# Patient Record
Sex: Female | Born: 1985 | Race: Black or African American | Hispanic: No | Marital: Single | Smoking: Former smoker
Health system: Southern US, Community
[De-identification: ages and names within clinical notes are randomized; demographics above are authoritative.]

## PROBLEM LIST (undated history)

## (undated) ENCOUNTER — Ambulatory Visit: Source: Home / Self Care

## (undated) DIAGNOSIS — J45909 Unspecified asthma, uncomplicated: Secondary | ICD-10-CM

---

## 2006-09-14 ENCOUNTER — Ambulatory Visit: Payer: Self-pay | Admitting: Family Medicine

## 2006-11-08 ENCOUNTER — Ambulatory Visit: Payer: Self-pay | Admitting: Family Medicine

## 2006-11-08 ENCOUNTER — Encounter (INDEPENDENT_AMBULATORY_CARE_PROVIDER_SITE_OTHER): Payer: Self-pay | Admitting: Family Medicine

## 2006-11-08 LAB — CONVERTED CEMR LAB
Antibody Screen: NEGATIVE
Basophils Absolute: 0 10*3/uL
Basophils Relative: 0 %
Eosinophils Absolute: 0.2 10*3/uL
Eosinophils Relative: 2 %
HCT: 37.6 %
Hemoglobin: 12.2 g/dL
Hepatitis B Surface Ag: NEGATIVE
Lymphocytes Relative: 21 %
Lymphs Abs: 2.3 10*3/uL
MCHC: 32.4 g/dL
MCV: 90.6 fL
Monocytes Absolute: 0.8 10*3/uL — ABNORMAL HIGH
Monocytes Relative: 7 %
Neutro Abs: 7.9 10*3/uL — ABNORMAL HIGH
Neutrophils Relative %: 70 %
Platelets: 269 10*3/uL
RBC: 4.15 M/uL
RDW: 13.3 %
Rh Type: POSITIVE
Rubella: 94.5 [IU]/mL — ABNORMAL HIGH
WBC: 11.2 10*3/uL — ABNORMAL HIGH

## 2006-11-22 ENCOUNTER — Ambulatory Visit: Payer: Self-pay | Admitting: Family Medicine

## 2006-11-22 ENCOUNTER — Encounter (INDEPENDENT_AMBULATORY_CARE_PROVIDER_SITE_OTHER): Payer: Self-pay | Admitting: Family Medicine

## 2006-11-22 ENCOUNTER — Other Ambulatory Visit: Admission: RE | Admit: 2006-11-22 | Discharge: 2006-11-22 | Payer: Self-pay | Admitting: Family Medicine

## 2006-11-22 LAB — CONVERTED CEMR LAB
Chlamydia, DNA Probe: NEGATIVE
GC Probe Amp, Genital: NEGATIVE

## 2006-11-25 ENCOUNTER — Encounter (INDEPENDENT_AMBULATORY_CARE_PROVIDER_SITE_OTHER): Payer: Self-pay | Admitting: Family Medicine

## 2006-11-28 ENCOUNTER — Encounter (INDEPENDENT_AMBULATORY_CARE_PROVIDER_SITE_OTHER): Payer: Self-pay | Admitting: Family Medicine

## 2006-11-30 ENCOUNTER — Ambulatory Visit (HOSPITAL_COMMUNITY): Admission: RE | Admit: 2006-11-30 | Discharge: 2006-11-30 | Payer: Self-pay | Admitting: Sports Medicine

## 2006-11-30 ENCOUNTER — Encounter (INDEPENDENT_AMBULATORY_CARE_PROVIDER_SITE_OTHER): Payer: Self-pay | Admitting: Family Medicine

## 2007-01-10 ENCOUNTER — Ambulatory Visit: Payer: Self-pay | Admitting: Family Medicine

## 2007-01-12 ENCOUNTER — Ambulatory Visit: Payer: Self-pay | Admitting: Family Medicine

## 2007-01-17 ENCOUNTER — Encounter (INDEPENDENT_AMBULATORY_CARE_PROVIDER_SITE_OTHER): Payer: Self-pay | Admitting: Family Medicine

## 2007-01-17 ENCOUNTER — Ambulatory Visit (HOSPITAL_COMMUNITY): Admission: RE | Admit: 2007-01-17 | Discharge: 2007-01-17 | Payer: Self-pay | Admitting: Pediatrics

## 2007-02-08 ENCOUNTER — Ambulatory Visit: Payer: Self-pay | Admitting: Family Medicine

## 2007-02-08 LAB — CONVERTED CEMR LAB: Hemoglobin: 11.6 g/dL

## 2007-02-23 ENCOUNTER — Ambulatory Visit: Payer: Self-pay | Admitting: Family Medicine

## 2007-03-01 ENCOUNTER — Encounter (INDEPENDENT_AMBULATORY_CARE_PROVIDER_SITE_OTHER): Payer: Self-pay | Admitting: Family Medicine

## 2007-03-01 ENCOUNTER — Ambulatory Visit (HOSPITAL_COMMUNITY): Admission: RE | Admit: 2007-03-01 | Discharge: 2007-03-01 | Payer: Self-pay | Admitting: Family Medicine

## 2007-03-09 ENCOUNTER — Ambulatory Visit: Payer: Self-pay | Admitting: Family Medicine

## 2007-03-09 LAB — CONVERTED CEMR LAB: GTT, 1 hr: 131 mg/dL

## 2007-03-30 ENCOUNTER — Encounter: Payer: Self-pay | Admitting: Family Medicine

## 2007-03-31 ENCOUNTER — Telehealth: Payer: Self-pay | Admitting: *Deleted

## 2007-04-12 ENCOUNTER — Encounter (INDEPENDENT_AMBULATORY_CARE_PROVIDER_SITE_OTHER): Payer: Self-pay | Admitting: Family Medicine

## 2007-04-12 ENCOUNTER — Ambulatory Visit: Payer: Self-pay | Admitting: Family Medicine

## 2007-04-12 LAB — CONVERTED CEMR LAB
MCHC: 31.8 g/dL (ref 30.0–36.0)
MCV: 91.7 fL (ref 78.0–100.0)
RDW: 13.7 % (ref 11.5–15.5)
WBC: 11.1 10*3/uL — ABNORMAL HIGH (ref 4.0–10.5)

## 2007-04-13 ENCOUNTER — Ambulatory Visit: Payer: Self-pay | Admitting: Obstetrics and Gynecology

## 2007-04-13 ENCOUNTER — Encounter (INDEPENDENT_AMBULATORY_CARE_PROVIDER_SITE_OTHER): Payer: Self-pay | Admitting: Family Medicine

## 2007-04-13 ENCOUNTER — Ambulatory Visit (HOSPITAL_COMMUNITY): Admission: RE | Admit: 2007-04-13 | Discharge: 2007-04-13 | Payer: Self-pay | Admitting: Emergency Medicine

## 2007-04-19 ENCOUNTER — Ambulatory Visit: Payer: Self-pay | Admitting: Family Medicine

## 2007-04-19 ENCOUNTER — Telehealth: Payer: Self-pay | Admitting: *Deleted

## 2007-04-25 ENCOUNTER — Ambulatory Visit: Payer: Self-pay | Admitting: Obstetrics and Gynecology

## 2007-04-25 ENCOUNTER — Ambulatory Visit: Payer: Self-pay | Admitting: Family Medicine

## 2007-05-02 ENCOUNTER — Inpatient Hospital Stay (HOSPITAL_COMMUNITY): Admission: RE | Admit: 2007-05-02 | Discharge: 2007-05-06 | Payer: Self-pay | Admitting: Obstetrics & Gynecology

## 2007-05-02 ENCOUNTER — Ambulatory Visit: Payer: Self-pay | Admitting: Gynecology

## 2007-06-03 ENCOUNTER — Ambulatory Visit: Payer: Self-pay | Admitting: Family Medicine

## 2007-08-03 ENCOUNTER — Ambulatory Visit: Payer: Self-pay | Admitting: Family Medicine

## 2007-10-21 ENCOUNTER — Ambulatory Visit: Payer: Self-pay | Admitting: Family Medicine

## 2008-02-02 ENCOUNTER — Ambulatory Visit: Payer: Self-pay | Admitting: Family Medicine

## 2008-02-02 ENCOUNTER — Encounter: Payer: Self-pay | Admitting: Family Medicine

## 2008-05-03 ENCOUNTER — Ambulatory Visit: Payer: Self-pay | Admitting: Family Medicine

## 2008-05-03 LAB — CONVERTED CEMR LAB: Beta hcg, urine, semiquantitative: NEGATIVE

## 2008-06-07 IMAGING — US US OB FOLLOW-UP
1 series · 7 of 7 positions shown · non-contrast
Comparison: none

OBSTETRICAL ULTRASOUND:

 This ultrasound exam was performed in the [HOSPITAL] Ultrasound Department.  The OB US report was generated in the AS system, and faxed to the ordering physician.  This report is also available in [REDACTED] PACS.

[Series 1: us ob follow-up · 0.28mm/px · 7 of 7 slices shown]
[im 1/7]
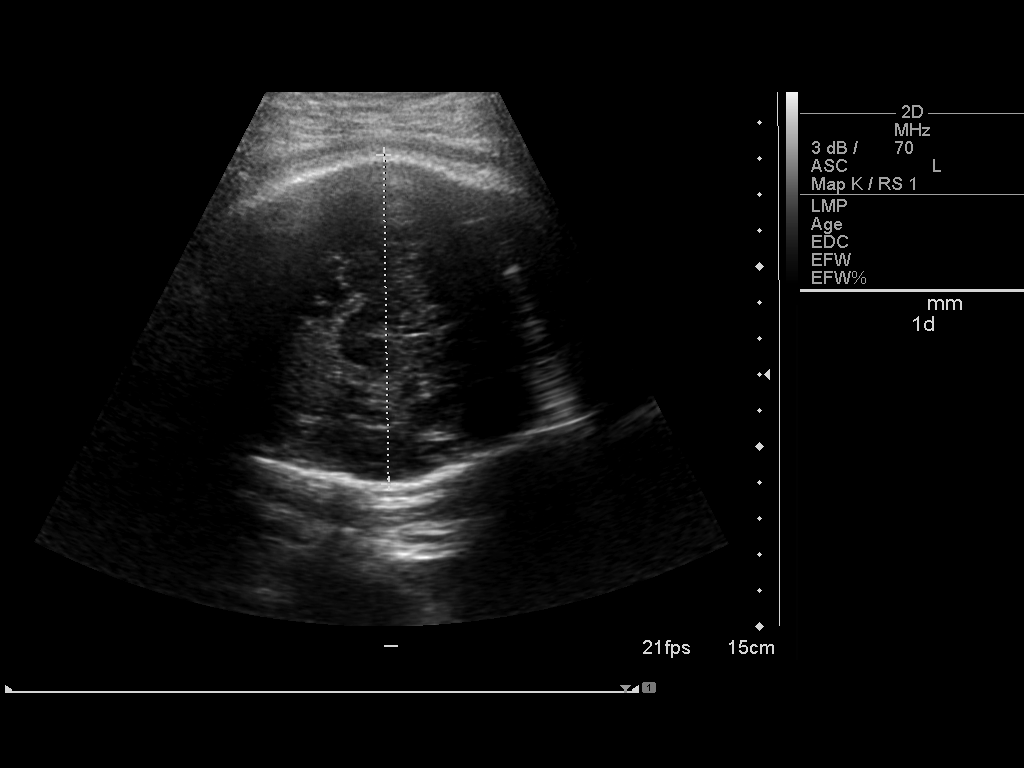
[im 2/7]
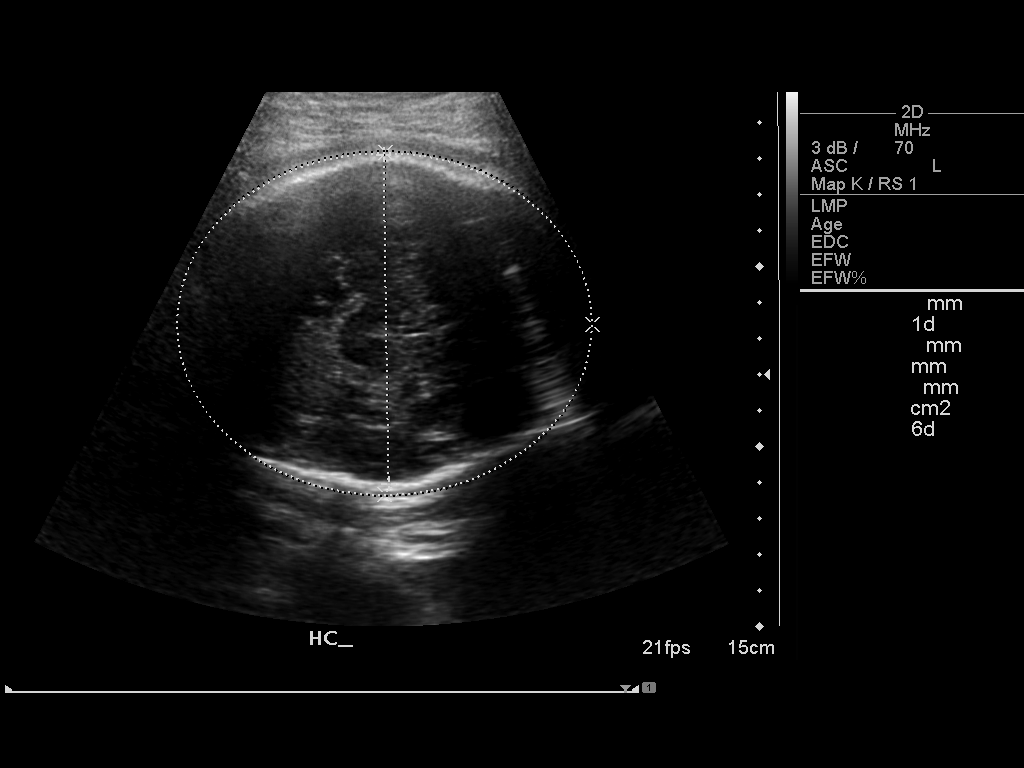
[im 3/7]
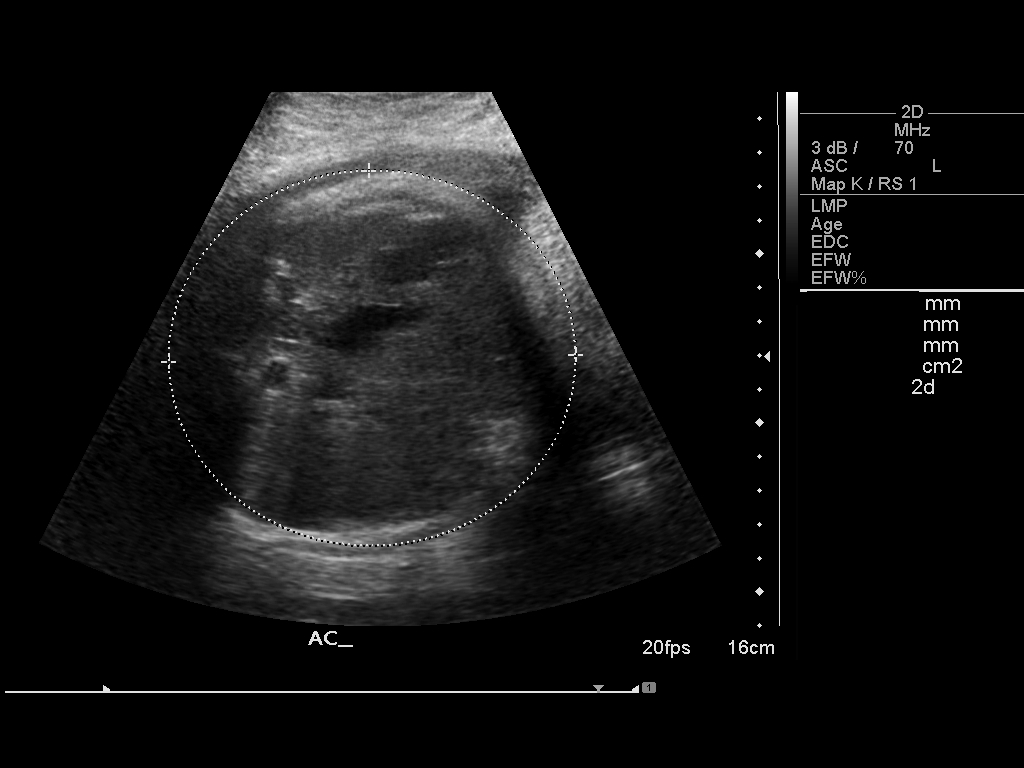
[im 4/7]
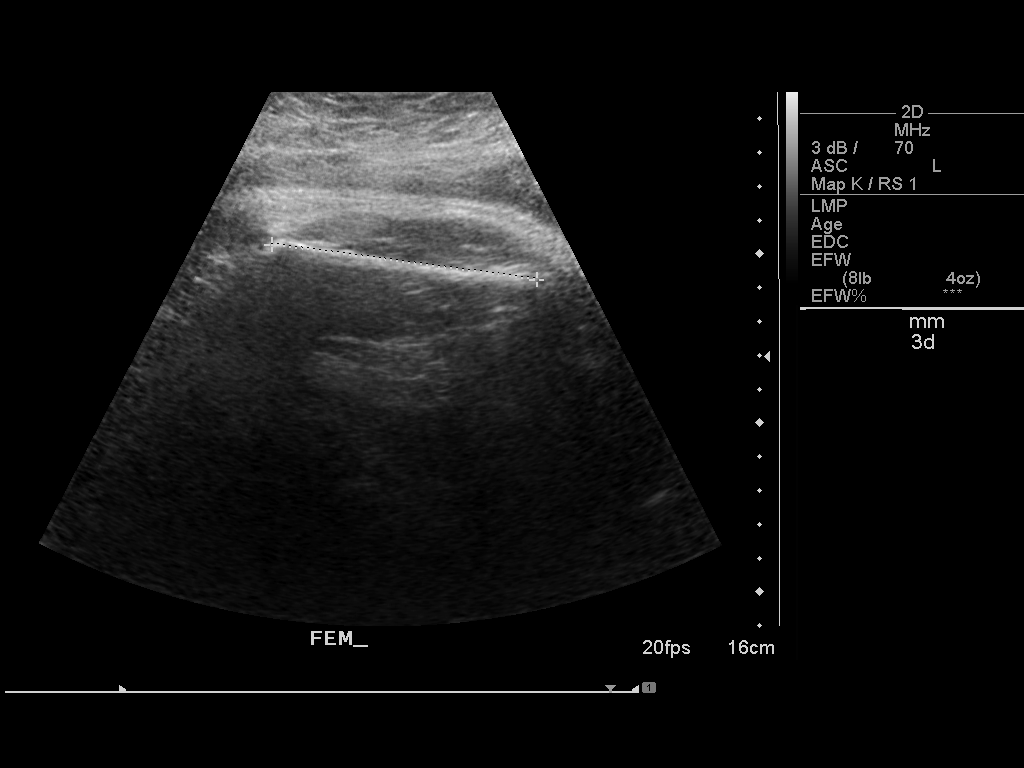
[im 5/7]
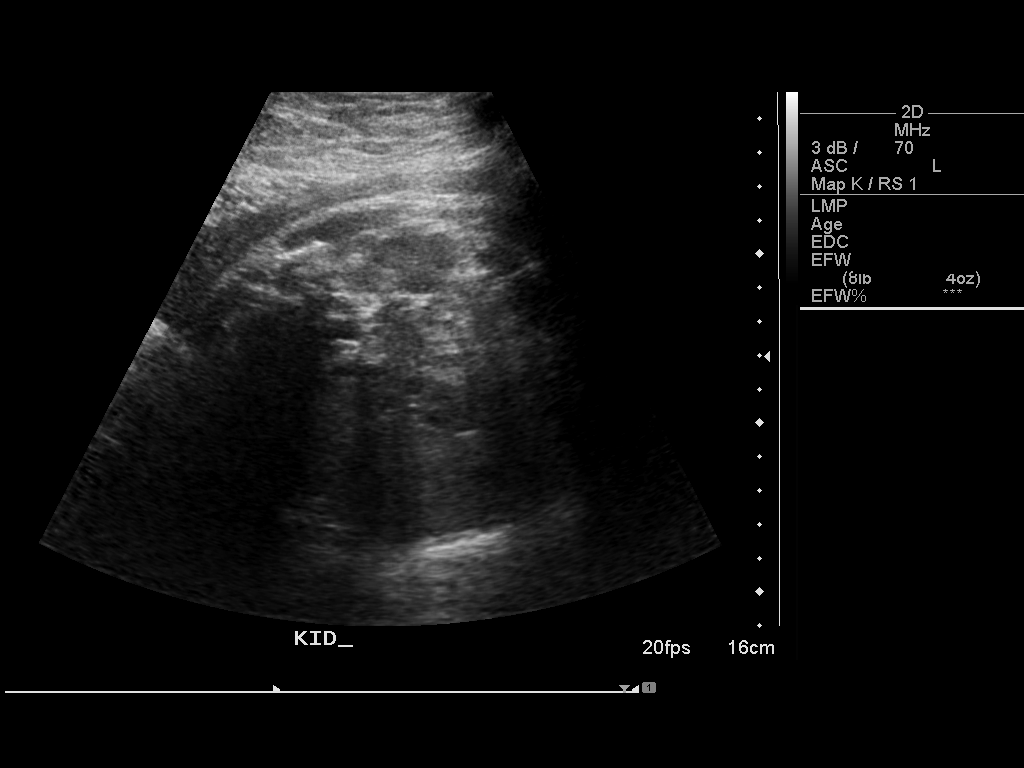
[im 6/7]
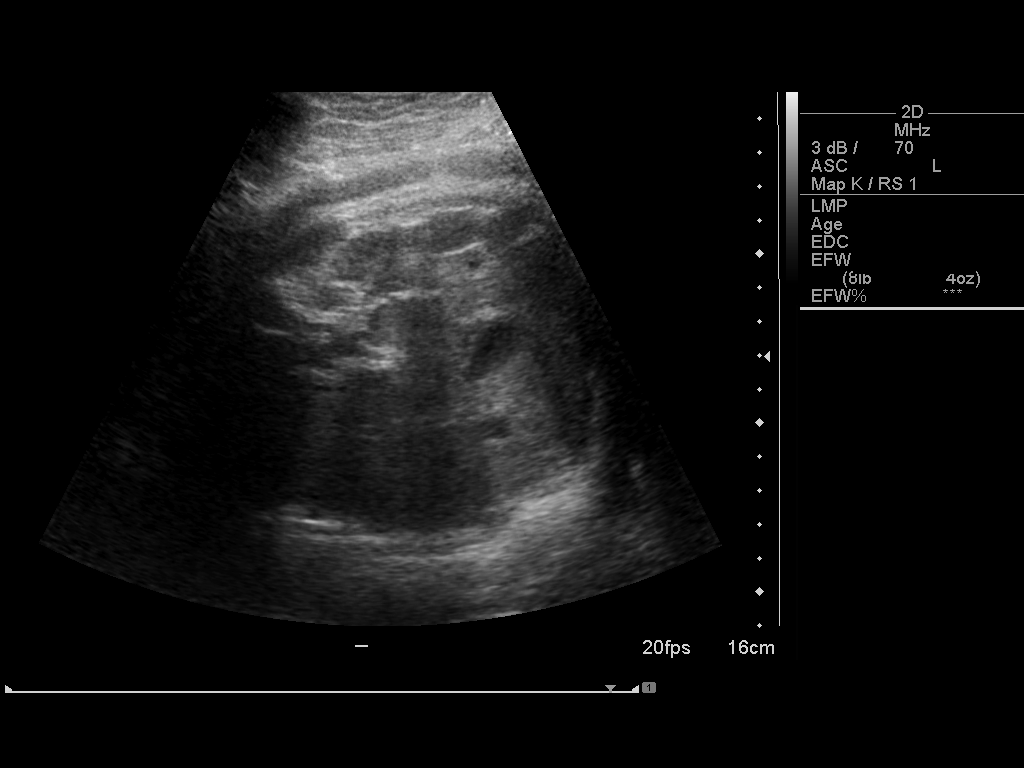
[im 7/7]
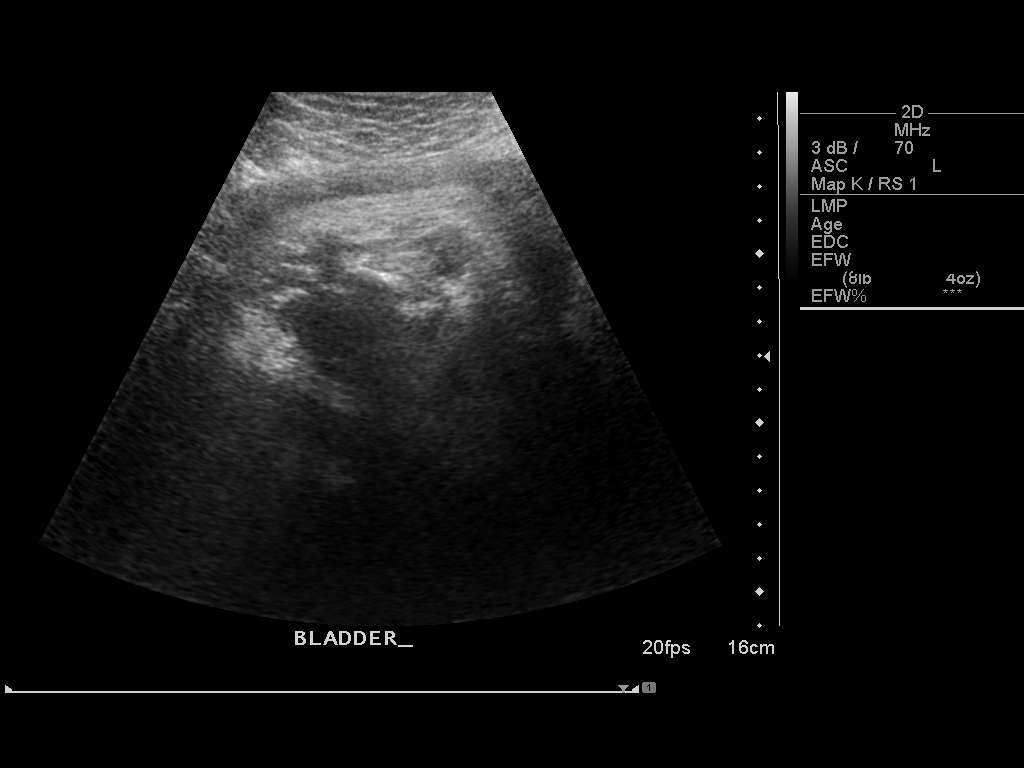

[7 of 7 positions shown; findings below may reference images not displayed]

IMPRESSION: See AS Obstetric US report.

## 2008-07-27 ENCOUNTER — Encounter (INDEPENDENT_AMBULATORY_CARE_PROVIDER_SITE_OTHER): Payer: Self-pay | Admitting: Family Medicine

## 2008-07-27 ENCOUNTER — Encounter: Payer: Self-pay | Admitting: Family Medicine

## 2008-07-27 ENCOUNTER — Ambulatory Visit: Payer: Self-pay | Admitting: Family Medicine

## 2008-07-27 DIAGNOSIS — J029 Acute pharyngitis, unspecified: Secondary | ICD-10-CM | POA: Insufficient documentation

## 2008-07-27 LAB — CONVERTED CEMR LAB: Rapid Strep: POSITIVE

## 2008-11-14 ENCOUNTER — Ambulatory Visit: Payer: Self-pay | Admitting: Family Medicine

## 2010-07-28 ENCOUNTER — Inpatient Hospital Stay (INDEPENDENT_AMBULATORY_CARE_PROVIDER_SITE_OTHER)
Admission: RE | Admit: 2010-07-28 | Discharge: 2010-07-28 | Disposition: A | Discharge status: Home / Self Care | Payer: BC Managed Care – PPO | Source: Ambulatory Visit | Attending: Family Medicine | Admitting: Family Medicine

## 2010-07-28 DIAGNOSIS — J020 Streptococcal pharyngitis: Secondary | ICD-10-CM

## 2010-07-29 LAB — POCT RAPID STREP A (OFFICE): Streptococcus, Group A Screen (Direct): POSITIVE — AB

## 2010-10-07 NOTE — Op Note (Signed)
Karla Estrada, Karla Estrada                ACCOUNT NO.:  [id number]   MEDICAL RECORD NO.:  [id number]          PATIENT TYPE:  INP   LOCATION:  9148                          FACILITY:  [facility]   PHYSICIAN:  Ginger Carne, MD  DATE OF BIRTH:  05-07-1986   DATE OF PROCEDURE:  DATE OF DISCHARGE:                               OPERATIVE REPORT   PREOPERATIVE DIAGNOSIS:  Failure to progress first stage of labor.   POSTOPERATIVE DIAGNOSIS:  1. Failure to progress first stage of labor.  2. Term viable delivery of female infant.   PROCEDURE:  Primary low transverse cesarean section.   SURGEON:  Ginger Carne, MD.   ASSISTANT:  None.   COMPLICATIONS:  None immediate.   ESTIMATED BLOOD LOSS:  650 mL.   ANESTHESIA:  Spinal.   SPECIMEN:  Cord bloods.   OPERATIVE FINDINGS:  Term infant female delivered in a vertex posterior  deflexed presentation.  Apgar and weight per delivery room record, no  gross abnormalities.  Baby cried spontaneously at delivery.  Amniotic  fluid was clear, non foul-smelling.  Uterus, tubes and ovaries showed  normal decidual changes of pregnancy.  Three-vessel cord central  insertion, complete placenta.   OPERATIVE PROCEDURE:  The patient prepped and draped in the usual  fashion and placed in the left lateral supine position.  Betadine  solution used for antiseptic and the patient was catheterized prior to  procedure.  After adequate spinal analgesia, a Pfannenstiel incision was  made and the abdomen opened.  The lower uterine segment was incised  transversely after developing the bladder flap.  The baby delivered,  cord clamped and cut and infant given to the pediatric staff after bulb  suctioning.  Placenta removed manually, uterus inspected.  Closure of  the uterine musculature in one layer of #0 Vicryl running interlocking  suture from either end to midline.  Bleeding  points hemostatically checked.  Blood clots removed.  Closure of the  fascia with double loop  zero PDS running suture.  Skin staples for the  skin.  The patient tolerated the procedure well and returned to post  anesthesia recovery room in excellent condition.      Ginger Carne, MD  Electronically Signed     SHB/MEDQ  D:  05/03/2007  T:  05/03/2007  Job:  [id number]

## 2010-10-10 NOTE — Discharge Summary (Signed)
NAMEARACELYS, GLADE                ACCOUNT NO.:  [id number]   MEDICAL RECORD NO.:  [id number]          PATIENT TYPE:  INP   LOCATION:  9148                          FACILITY:  [facility]   PHYSICIAN:  Lazaro Arms, M.D.   DATE OF BIRTH:  02-Jun-1985   DATE OF ADMISSION:  05/02/2007  DATE OF DISCHARGE:  05/06/2007                               DISCHARGE SUMMARY   REASON FOR ADMISSION:  Induction of labor for post dates.   DISCHARGE DIAGNOSIS:  Primary low transverse cesarean section for  failure to progress.   BRIEF HOSPITAL COURSE:  This is a 25 year old G1, P0, who presented at  approximately 72 and 2 weeks' gestation for induction of labor for post  dates.  The infant had been measuring large for gestational age.  Induction was attempted with Pitocin.  The patient initially made  progression but stalled around 3-4 cm so was taken for C-section under a  spinal anesthesia.  She went on to deliver a viable female infant weighing  8 pounds 4 ounces with Apgars of 9 and 9.  Estimated blood loss was 650  mL.  Mom is breast-feeding, using Depo for contraception.  She was GBS-  positive.  Blood type:  See paper chart.  Discharge hemoglobin 9.8.  rubella immune.  Rh positive.  The patient will follow up next week at  the family practice center to have her staples removed and then she will  have a routine postpartum follow-up visit in 3 weeks.  Mom and baby were  discharged home without any complications after the usual postpartum  course.      Altamese Cabal, M.D.      Lazaro Arms, M.D.  Electronically Signed    KS/MEDQ  D:  05/15/2007  T:  05/16/2007  Job:  [id number]

## 2011-03-02 LAB — CBC
HCT: 28.6 — ABNORMAL LOW
HCT: 35.1 — ABNORMAL LOW
MCHC: 34.3
MCHC: 34.8
Platelets: 190
WBC: 11.8 — ABNORMAL HIGH
WBC: 13.8 — ABNORMAL HIGH

## 2011-03-02 LAB — RPR: RPR Ser Ql: NONREACTIVE

## 2012-09-20 ENCOUNTER — Encounter (HOSPITAL_COMMUNITY): Payer: Self-pay | Admitting: Emergency Medicine

## 2012-09-20 ENCOUNTER — Emergency Department (INDEPENDENT_AMBULATORY_CARE_PROVIDER_SITE_OTHER)
Admission: EM | Admit: 2012-09-20 | Discharge: 2012-09-20 | Disposition: A | Discharge status: Home / Self Care | Payer: Self-pay | Source: Home / Self Care | Attending: Family Medicine | Admitting: Family Medicine

## 2012-09-20 DIAGNOSIS — R591 Generalized enlarged lymph nodes: Secondary | ICD-10-CM

## 2012-09-20 DIAGNOSIS — R599 Enlarged lymph nodes, unspecified: Secondary | ICD-10-CM

## 2012-09-20 HISTORY — DX: Unspecified asthma, uncomplicated: J45.909

## 2012-09-20 MED ORDER — CEPHALEXIN 500 MG PO CAPS: 500.0000 mg | ORAL_CAPSULE | Freq: Four times a day (QID) | ORAL | Status: DC

## 2012-09-20 NOTE — ED Notes (Signed)
Pt c/o swollen lymphnodes x 3 days. Has pain on right side from the ear down to her shoulder. Has had congestion in her chest. Pt is alert and oriented.

## 2012-09-20 NOTE — ED Provider Notes (Signed)
History     CSN: [id number]  Arrival date & time 09/20/12  1445   First MD Initiated Contact with Patient 09/20/12 1543      Chief Complaint  Patient presents with  . Lymphadenopathy    (Consider location/radiation/quality/duration/timing/severity/associated sxs/prior treatment) Patient is a 27 y.o. female presenting with neck injury. The history is provided by the patient. No language interpreter was used.  Neck Injury This is a new problem. The current episode started more than 2 days ago. The problem occurs constantly. The problem has been gradually worsening. Pertinent negatives include no chest pain, no abdominal pain and no shortness of breath. Nothing aggravates the symptoms. Nothing relieves the symptoms. She has tried nothing for the symptoms.   Pt complains of sore swollen glands Past Medical History  Diagnosis Date  . Asthma     Past Surgical History  Procedure Laterality Date  . Cesarean section      No family history on file.  History  Substance Use Topics  . Smoking status: Current Every Day Smoker -- 0.50 packs/day    Types: Cigarettes  . Smokeless tobacco: Not on file  . Alcohol Use: No    OB History   Grav Para Term Preterm Abortions TAB SAB Ect Mult Living                  Review of Systems  Respiratory: Negative for shortness of breath.   Cardiovascular: Negative for chest pain.  Gastrointestinal: Negative for abdominal pain.  All other systems reviewed and are negative.    Allergies  Review of patient's allergies indicates no known allergies.  Home Medications  No current outpatient prescriptions on file.  BP 110/76  Pulse 85  Temp(Src) 98.1 F (36.7 C) (Oral)  Resp 16  SpO2 94%  LMP 09/02/2012  Physical Exam  Nursing note and vitals reviewed. Constitutional: She appears well-developed and well-nourished.  HENT:  Head: Normocephalic and atraumatic.  Left Ear: External ear normal.  Nose: Nose normal.  Mouth/Throat:  Oropharynx is clear and moist.  Tender right post auricular and anterior cervical lymph nodes,  No obvious area of infection  Eyes: Conjunctivae are normal. Pupils are equal, round, and reactive to light.  Neck: Normal range of motion.  Cardiovascular: Normal rate and regular rhythm.   Pulmonary/Chest: Effort normal.  Musculoskeletal: Normal range of motion.  Lymphadenopathy:    She has cervical adenopathy.  Neurological: She is alert.  Skin: Skin is warm.    ED Course  Procedures (including critical care time)  Labs Reviewed - No data to display No results found.   1. Lymphadenopathy       MDM  Keflex  Follow up at family practice for recheck in 1 week       Elson Areas, PA-C 09/20/12 1619  Lonia Skinner [place], PA-C 09/25/12 1521

## 2012-09-25 NOTE — ED Provider Notes (Signed)
Medical screening examination/treatment/procedure(s) were performed by non-physician practitioner and as supervising physician I was immediately available for consultation/collaboration.  Raynald Blend, MD 09/25/12 [phone]

## 2013-09-22 ENCOUNTER — Encounter (HOSPITAL_COMMUNITY): Payer: Self-pay | Admitting: Emergency Medicine

## 2013-09-22 ENCOUNTER — Emergency Department (INDEPENDENT_AMBULATORY_CARE_PROVIDER_SITE_OTHER)
Admission: EM | Admit: 2013-09-22 | Discharge: 2013-09-22 | Disposition: A | Discharge status: Home / Self Care | Payer: Self-pay | Source: Home / Self Care

## 2013-09-22 DIAGNOSIS — J039 Acute tonsillitis, unspecified: Secondary | ICD-10-CM

## 2013-09-22 DIAGNOSIS — R0982 Postnasal drip: Secondary | ICD-10-CM

## 2013-09-22 DIAGNOSIS — J309 Allergic rhinitis, unspecified: Secondary | ICD-10-CM

## 2013-09-22 LAB — POCT RAPID STREP A: Streptococcus, Group A Screen (Direct): NEGATIVE

## 2013-09-22 MED ORDER — AMOXICILLIN 500 MG PO CAPS: 1000.0000 mg | ORAL_CAPSULE | Freq: Two times a day (BID) | ORAL | Status: DC

## 2013-09-22 NOTE — ED Provider Notes (Signed)
CSN: [id number]     Arrival date & time 09/22/13  0941 History   First MD Initiated Contact with Patient 09/22/13 1009     Chief Complaint  Patient presents with  . Sore Throat  . Facial Pain   (Consider location/radiation/quality/duration/timing/severity/associated sxs/prior Treatment) HPI Comments: C/o sore throat for 1 d. Preceded by nasal congestion with PND for 3 d. Mild R ear discomfort.   Past Medical History  Diagnosis Date  . Asthma    Past Surgical History  Procedure Laterality Date  . Cesarean section     History reviewed. No pertinent family history. History  Substance Use Topics  . Smoking status: Current Every Day Smoker -- 0.50 packs/day    Types: Cigarettes  . Smokeless tobacco: Not on file  . Alcohol Use: No   OB History   Grav Para Term Preterm Abortions TAB SAB Ect Mult Living                 Review of Systems  Constitutional: Negative.  Negative for fever, chills, activity change, appetite change and fatigue.  HENT: Positive for congestion, ear pain, postnasal drip, rhinorrhea and sore throat. Negative for ear discharge and facial swelling.   Eyes: Negative.   Respiratory: Positive for cough.   Cardiovascular: Negative.   Gastrointestinal: Negative.   Musculoskeletal: Negative.  Negative for neck pain and neck stiffness.  Skin: Negative for pallor and rash.  Neurological: Negative.     Allergies  Review of patient's allergies indicates no known allergies.  Home Medications   Prior to Admission medications   Medication Sig Start Date End Date Taking? Authorizing Provider  cephALEXin (KEFLEX) 500 MG capsule Take 1 capsule (500 mg total) by mouth 4 (four) times daily. 09/20/12   Elson AreasLeslie K Sofia, PA-C   BP 133/84  Pulse 86  Temp(Src) 98.1 F (36.7 C) (Oral)  Resp 16  SpO2 99%  LMP 09/14/2013 Physical Exam  Nursing note and vitals reviewed. Constitutional: She is oriented to person, place, and time. She appears well-developed and  well-nourished. No distress.  HENT:  Head: Normocephalic and atraumatic.  Mouth/Throat: Oropharyngeal exudate present.  Bilat TMs with mild retraction. No erythema or effusion OP with enlarged, cryptic palatine tonsils covered with exudates.  Eyes: EOM are normal. Pupils are equal, round, and reactive to light.  Neck: Normal range of motion. Neck supple.  Cardiovascular: Normal rate and regular rhythm.   Pulmonary/Chest: Effort normal and breath sounds normal. No respiratory distress. She has no wheezes. She has no rales.  Musculoskeletal: She exhibits no edema and no tenderness.  Lymphadenopathy:    She has no cervical adenopathy.  Neurological: She is alert and oriented to person, place, and time. No cranial nerve deficit.  Skin: Skin is warm and dry.  Psychiatric: She has a normal mood and affect.    ED Course  Procedures (including critical care time) Labs Review Labs Reviewed - No data to display  Imaging Review No results found. Rapid strep verbally reported as neg.  MDM   1. Tonsillitis with exudate   2. PND (post-nasal drip)   3. Allergic rhinitis     Amoxicillin OTC meds for allergic rhinitis Nasal sprays fluids     Hayden Rasmussenavid Leighanne Adolph, NP 09/22/13 1031

## 2013-09-22 NOTE — ED Notes (Signed)
C/o  Severe sore throat with white patches and redness.  Sinus congestion.  Low grade temp.  X 2 days.  Denies n/v/d   No otc meds taken for symptoms.

## 2013-09-22 NOTE — ED Provider Notes (Signed)
Medical screening examination/treatment/procedure(s) were performed by resident physician or non-physician practitioner and as supervising physician I was immediately available for consultation/collaboration.   Barkley BrunsKINDL,JAMES DOUGLAS MD.   Linna HoffJames D Kindl, MD 09/22/13 [phone]

## 2013-09-22 NOTE — Discharge Instructions (Signed)
Allergic Rhinitis Allegra or zyrtec for drainage Sudafed PE for congestion flonase nasal spray Saline nasal spray frequently Cepacol lozenges Ibuprofen 600 mg every 6 hours as needed Allergic rhinitis is when the mucous membranes in the nose respond to allergens. Allergens are particles in the air that cause your body to have an allergic reaction. This causes you to release allergic antibodies. Through a chain of events, these eventually cause you to release histamine into the blood stream. Although meant to protect the body, it is this release of histamine that causes your discomfort, such as frequent sneezing, congestion, and an itchy, runny nose.  CAUSES  Seasonal allergic rhinitis (hay fever) is caused by pollen allergens that may come from grasses, trees, and weeds. Year-round allergic rhinitis (perennial allergic rhinitis) is caused by allergens such as house dust mites, pet dander, and mold spores.  SYMPTOMS   Nasal stuffiness (congestion).  Itchy, runny nose with sneezing and tearing of the eyes. DIAGNOSIS  Your health care provider can help you determine the allergen or allergens that trigger your symptoms. If you and your health care provider are unable to determine the allergen, skin or blood testing may be used. TREATMENT  Allergic Rhinitis does not have a cure, but it can be controlled by:  Medicines and allergy shots (immunotherapy).  Avoiding the allergen. Hay fever may often be treated with antihistamines in pill or nasal spray forms. Antihistamines block the effects of histamine. There are over-the-counter medicines that may help with nasal congestion and swelling around the eyes. Check with your health care provider before taking or giving this medicine.  If avoiding the allergen or the medicine prescribed do not work, there are many new medicines your health care provider can prescribe. Stronger medicine may be used if initial measures are ineffective. Desensitizing  injections can be used if medicine and avoidance does not work. Desensitization is when a patient is given ongoing shots until the body becomes less sensitive to the allergen. Make sure you follow up with your health care provider if problems continue. HOME CARE INSTRUCTIONS It is not possible to completely avoid allergens, but you can reduce your symptoms by taking steps to limit your exposure to them. It helps to know exactly what you are allergic to so that you can avoid your specific triggers. SEEK MEDICAL CARE IF:   You have a fever.  You develop a cough that does not stop easily (persistent).  You have shortness of breath.  You start wheezing.  Symptoms interfere with normal daily activities. Document Released: 02/03/2001 Document Revised: 03/01/2013 Document Reviewed: 01/16/2013 [facility] Patient Information 2014 [place], [place].  Pharyngitis Pharyngitis is redness, pain, and swelling (inflammation) of your pharynx.  CAUSES  Pharyngitis is usually caused by infection. Most of the time, these infections are from viruses (viral) and are part of a cold. However, sometimes pharyngitis is caused by bacteria (bacterial). Pharyngitis can also be caused by allergies. Viral pharyngitis may be spread from person to person by coughing, sneezing, and personal items or utensils (cups, forks, spoons, toothbrushes). Bacterial pharyngitis may be spread from person to person by more intimate contact, such as kissing.  SIGNS AND SYMPTOMS  Symptoms of pharyngitis include:   Sore throat.   Tiredness (fatigue).   Low-grade fever.   Headache.  Joint pain and muscle aches.  Skin rashes.  Swollen lymph nodes.  Plaque-like film on throat or tonsils (often seen with bacterial pharyngitis). DIAGNOSIS  Your health care provider will ask you questions about your illness and  your symptoms. Your medical history, along with a physical exam, is often all that is needed to diagnose pharyngitis.  Sometimes, a rapid strep test is done. Other lab tests may also be done, depending on the suspected cause.  TREATMENT  Viral pharyngitis will usually get better in 3 4 days without the use of medicine. Bacterial pharyngitis is treated with medicines that kill germs (antibiotics).  HOME CARE INSTRUCTIONS   Drink enough water and fluids to keep your urine clear or pale yellow.   Only take over-the-counter or prescription medicines as directed by your health care provider:   If you are prescribed antibiotics, make sure you finish them even if you start to feel better.   Do not take aspirin.   Get lots of rest.   Gargle with 8 oz of salt water ( tsp of salt per 1 qt of water) as often as every 1 2 hours to soothe your throat.   Throat lozenges (if you are not at risk for choking) or sprays may be used to soothe your throat. SEEK MEDICAL CARE IF:   You have large, tender lumps in your neck.  You have a rash.  You cough up green, yellow-brown, or bloody spit. SEEK IMMEDIATE MEDICAL CARE IF:   Your neck becomes stiff.  You drool or are unable to swallow liquids.  You vomit or are unable to keep medicines or liquids down.  You have severe pain that does not go away with the use of recommended medicines.  You have trouble breathing (not caused by a stuffy nose). MAKE SURE YOU:   Understand these instructions.  Will watch your condition.  Will get help right away if you are not doing well or get worse. Document Released: 05/11/2005 Document Revised: 03/01/2013 Document Reviewed: 01/16/2013 [facility] Patient Information 2014 [place], [place].  Tonsillitis Tonsillitis is an infection of the throat that causes the tonsils to become red, tender, and swollen. Tonsils are collections of lymphoid tissue at the back of the throat. Each tonsil has crevices (crypts). Tonsils help fight nose and throat infections and keep infection from spreading to other parts of the body for the  first 18 months of life.  CAUSES Sudden (acute) tonsillitis is usually caused by infection with streptococcal bacteria. Long-lasting (chronic) tonsillitis occurs when the crypts of the tonsils become filled with pieces of food and bacteria, which makes it easy for the tonsils to become repeatedly infected. SYMPTOMS  Symptoms of tonsillitis include:  A sore throat, with possible difficulty swallowing.  White patches on the tonsils.  Fever.  Tiredness.  New episodes of snoring during sleep, when you did not snore before.  Small, foul-smelling, yellowish-white pieces of material (tonsilloliths) that you occasionally cough up or spit out. The tonsilloliths can also cause you to have bad breath. DIAGNOSIS Tonsillitis can be diagnosed through a physical exam. Diagnosis can be confirmed with the results of lab tests, including a throat culture. TREATMENT  The goals of tonsillitis treatment include the reduction of the severity and duration of symptoms and prevention of associated conditions. Symptoms of tonsillitis can be improved with the use of steroids to reduce the swelling. Tonsillitis caused by bacteria can be treated with antibiotics. Usually, treatment with antibiotics is started before the cause of the tonsillitis is known. However, if it is determined that the cause is not bacterial, antibiotics will not treat the tonsillitis. If attacks of tonsillitis are severe and frequent, your caregiver may recommend surgery to remove the tonsils (tonsillectomy). HOME CARE INSTRUCTIONS  Rest as much as possible and get plenty of sleep.  Drink plenty of fluids. While the throat is very sore, eat soft foods or liquids, such as sherbet, soups, or instant breakfast drinks.  Eat frozen ice pops.  Gargle with a warm or cold liquid to help soothe the throat. Mix 1/4 teaspoon of salt and 1/4 teaspoon of baking soda in in 8 oz of water. SEEK MEDICAL CARE IF:   Large, tender lumps develop in your  neck.  A rash develops.  A green, yellow-brown, or bloody substance is coughed up.  You are unable to swallow liquids or food for 24 hours.  You notice that only one of the tonsils is swollen. SEEK IMMEDIATE MEDICAL CARE IF:   You develop any new symptoms such as vomiting, severe headache, stiff neck, chest pain, or trouble breathing or swallowing.  You have severe throat pain along with drooling or voice changes.  You have severe pain, unrelieved with recommended medications.  You are unable to fully open the mouth.  You develop redness, swelling, or severe pain anywhere in the neck.  You have a fever. MAKE SURE YOU:   Understand these instructions.  Will watch your condition.  Will get help right away if you are not doing well or get worse. Document Released: 02/18/2005 Document Revised: 01/11/2013 Document Reviewed: 10/28/2012 [facility] Patient Information 2014 [place], [place].

## 2013-09-25 LAB — CULTURE, GROUP A STREP

## 2013-09-25 LAB — POCT RAPID STREP A: STREPTOCOCCUS, GROUP A SCREEN (DIRECT): NEGATIVE

## 2013-12-12 ENCOUNTER — Encounter (HOSPITAL_COMMUNITY): Payer: Self-pay | Admitting: Emergency Medicine

## 2013-12-12 ENCOUNTER — Emergency Department (HOSPITAL_COMMUNITY)
Admission: EM | Admit: 2013-12-12 | Discharge: 2013-12-13 | Disposition: A | Discharge status: Home / Self Care | Payer: Self-pay | Attending: Emergency Medicine | Admitting: Emergency Medicine

## 2013-12-12 DIAGNOSIS — T7840XA Allergy, unspecified, initial encounter: Secondary | ICD-10-CM

## 2013-12-12 DIAGNOSIS — L500 Allergic urticaria: Secondary | ICD-10-CM | POA: Insufficient documentation

## 2013-12-12 DIAGNOSIS — Y9389 Activity, other specified: Secondary | ICD-10-CM | POA: Insufficient documentation

## 2013-12-12 DIAGNOSIS — T628X1A Toxic effect of other specified noxious substances eaten as food, accidental (unintentional), initial encounter: Secondary | ICD-10-CM | POA: Insufficient documentation

## 2013-12-12 DIAGNOSIS — J4520 Mild intermittent asthma, uncomplicated: Secondary | ICD-10-CM

## 2013-12-12 DIAGNOSIS — R221 Localized swelling, mass and lump, neck: Secondary | ICD-10-CM

## 2013-12-12 DIAGNOSIS — Y929 Unspecified place or not applicable: Secondary | ICD-10-CM | POA: Insufficient documentation

## 2013-12-12 DIAGNOSIS — F17200 Nicotine dependence, unspecified, uncomplicated: Secondary | ICD-10-CM | POA: Insufficient documentation

## 2013-12-12 DIAGNOSIS — J45901 Unspecified asthma with (acute) exacerbation: Secondary | ICD-10-CM | POA: Insufficient documentation

## 2013-12-12 DIAGNOSIS — R220 Localized swelling, mass and lump, head: Secondary | ICD-10-CM | POA: Insufficient documentation

## 2013-12-12 NOTE — ED Notes (Signed)
Pt states tonight she was sitting on her couch and her nose started feeling congested then her throat started feeling sore  Pt states then she started sneezing and coughing and wheezing  Pt states she has asthma but that has been fine lately and it is usually exercise induced  Pt states she is itching all over and has developed a rash and her eyes are starting to get puffy  Pt denies eating anything or using anything different

## 2013-12-13 MED ORDER — DIPHENHYDRAMINE HCL 50 MG/ML IJ SOLN
25.0000 mg | Freq: Once | INTRAMUSCULAR | Status: AC
  Administered 2013-12-13: 25 mg via INTRAVENOUS
  Filled 2013-12-13: qty 1

## 2013-12-13 MED ORDER — SODIUM CHLORIDE 0.9 % IV BOLUS (SEPSIS)
1000.0000 mL | INTRAVENOUS | Status: AC
  Administered 2013-12-13: 1000 mL via INTRAVENOUS

## 2013-12-13 MED ORDER — DIPHENHYDRAMINE HCL 25 MG PO TABS: 25.0000 mg | ORAL_TABLET | Freq: Four times a day (QID) | ORAL | Status: DC | PRN

## 2013-12-13 MED ORDER — FAMOTIDINE 20 MG PO TABS: 20.0000 mg | ORAL_TABLET | Freq: Two times a day (BID) | ORAL | Status: DC

## 2013-12-13 MED ORDER — PREDNISONE 10 MG PO TABS: 40.0000 mg | ORAL_TABLET | Freq: Every day | ORAL | Status: DC

## 2013-12-13 MED ORDER — ALBUTEROL SULFATE (2.5 MG/3ML) 0.083% IN NEBU
5.0000 mg | INHALATION_SOLUTION | Freq: Once | RESPIRATORY_TRACT | Status: AC
  Administered 2013-12-13: 5 mg via RESPIRATORY_TRACT
  Filled 2013-12-13: qty 6

## 2013-12-13 MED ORDER — FAMOTIDINE IN NACL 20-0.9 MG/50ML-% IV SOLN
20.0000 mg | Freq: Once | INTRAVENOUS | Status: AC
  Administered 2013-12-13: 20 mg via INTRAVENOUS
  Filled 2013-12-13: qty 50

## 2013-12-13 MED ORDER — ALBUTEROL SULFATE HFA 108 (90 BASE) MCG/ACT IN AERS: 2.0000 | INHALATION_SPRAY | RESPIRATORY_TRACT | Status: DC | PRN

## 2013-12-13 MED ORDER — METHYLPREDNISOLONE SODIUM SUCC 125 MG IJ SOLR
125.0000 mg | Freq: Once | INTRAMUSCULAR | Status: AC
  Administered 2013-12-13: 125 mg via INTRAVENOUS
  Filled 2013-12-13: qty 2

## 2013-12-13 MED ORDER — OPTICHAMBER ADVANTAGE MISC
1.0000 | Freq: Once | Status: DC
  Filled 2013-12-13: qty 1

## 2013-12-13 NOTE — ED Provider Notes (Signed)
CSN: [id number]     Arrival date & time 12/12/13  2307 History   First MD Initiated Contact with Patient 12/13/13 0003     Chief Complaint  Patient presents with  . Allergic Reaction     (Consider location/radiation/quality/duration/timing/severity/associated sxs/prior Treatment) The history is provided by the patient and medical records. No language interpreter was used.    Karla Estrada is a 28 y.o. female  with a hx of asthma presents to the Emergency Department complaining of gradual, persistent, progressively worsening coughing, sneezing, rash and iching onset 1 hour after eating a southwest salad from [place].  Pt reports she ate the salad around 8pm, but eats one per week.  Pt reports she has never had any allergies before.  Pt reports she took Benadryl 25mg  PTA with minor relief.  Pt reports the rash began on her arms, but has since spread all over her body.  She also reports associated symptoms include SOB, and swelling of her eyes.  Nothing makes it worse. Pt denies throat closing.  Pt denies fever, chills, headache, neck pain, chest pain, abd pain, N/V/D, weakness, dizziness, syncope.       Past Medical History  Diagnosis Date  . Asthma    Past Surgical History  Procedure Laterality Date  . Cesarean section     History reviewed. No pertinent family history. History  Substance Use Topics  . Smoking status: Current Every Day Smoker -- 0.50 packs/day    Types: Cigarettes  . Smokeless tobacco: Not on file  . Alcohol Use: Yes     Comment: rare   OB History   Grav Para Term Preterm Abortions TAB SAB Ect Mult Living                 Review of Systems  Constitutional: Negative for fever, diaphoresis, appetite change, fatigue and unexpected weight change.  HENT: Positive for facial swelling. Negative for mouth sores.   Eyes: Negative for visual disturbance.  Respiratory: Positive for shortness of breath and wheezing. Negative for cough and chest tightness.    Cardiovascular: Negative for chest pain.  Gastrointestinal: Negative for nausea, vomiting, abdominal pain, diarrhea and constipation.  Endocrine: Negative for polydipsia, polyphagia and polyuria.  Genitourinary: Negative for dysuria, urgency, frequency and hematuria.  Musculoskeletal: Negative for back pain and neck stiffness.  Skin: Positive for rash.  Allergic/Immunologic: Negative for immunocompromised state.  Neurological: Negative for syncope, light-headedness and headaches.  Hematological: Does not bruise/bleed easily.  Psychiatric/Behavioral: Negative for sleep disturbance. The patient is not nervous/anxious.       Allergies  Review of patient's allergies indicates no known allergies.  Home Medications   Prior to Admission medications   Medication Sig Start Date End Date Taking? Authorizing Provider  diphenhydrAMINE (BENADRYL) 25 mg capsule Take 25 mg by mouth every 6 (six) hours as needed.   Yes Historical Provider, MD  diphenhydrAMINE (BENADRYL) 25 MG tablet Take 1 tablet (25 mg total) by mouth every 6 (six) hours as needed for itching (Rash). 12/13/13   Orlen Leedy, PA-C  famotidine (PEPCID) 20 MG tablet Take 1 tablet (20 mg total) by mouth 2 (two) times daily. 12/13/13   Aislee Landgren, PA-C  predniSONE (DELTASONE) 10 MG tablet Take 4 tablets (40 mg total) by mouth daily. 12/13/13   Bingham Millette, PA-C   BP 131/85  Pulse 85  Temp(Src) 98.5 F (36.9 C) (Oral)  Resp 18  SpO2 100%  LMP 11/26/2013 Physical Exam  Nursing note and vitals reviewed. Constitutional: She is  oriented to person, place, and time. She appears well-developed and well-nourished. No distress.  HENT:  Head: Normocephalic and atraumatic.  Right Ear: Tympanic membrane, external ear and ear canal normal.  Left Ear: Tympanic membrane, external ear and ear canal normal.  Nose: Nose normal. No mucosal edema or rhinorrhea.  Mouth/Throat: Uvula is midline. No uvula swelling. No  oropharyngeal exudate, posterior oropharyngeal edema, posterior oropharyngeal erythema or tonsillar abscesses.  No swelling of the uvula or oropharynx   Eyes: Conjunctivae are normal.  Mild periorbital swelling without ecchymosis, erythema or induration to suggest periorbital cellulitis  Neck: Normal range of motion.  Patent airway No stridor; normal phonation Handling secretions without difficulty  Cardiovascular: Normal rate, regular rhythm, normal heart sounds and intact distal pulses.   No murmur heard. Pulmonary/Chest: Effort normal and breath sounds normal. No stridor. No respiratory distress. She has no wheezes.  No wheezes or rhonchi - diminished breath sounds throughout  Abdominal: Soft. Bowel sounds are normal. There is no tenderness.  Musculoskeletal: Normal range of motion. She exhibits no edema.  Neurological: She is alert and oriented to person, place, and time.  Skin: Skin is warm and dry. Rash noted. She is not diaphoretic.  Urticaria noted over the arms, legs, trunk and back Mild excoriations - no induration or fluctuance to indicate secondary infection  Psychiatric: She has a normal mood and affect.    ED Course  Procedures (including critical care time) Labs Review Labs Reviewed - No data to display  Imaging Review No results found.   EKG Interpretation None      MDM   Final diagnoses:  Allergic reaction, initial encounter  Asthma, mild intermittent, uncomplicated   Karla Estrada presents with evidence of allergic reaction. Patient expresses concern for possible food allergy.  Urticaria covering the entire body.  Patient endorsed shortness of breath and wheezing at home prior to arrival but was unable to use her inhaler as she could not find it. Patient with diminished breath sounds but no wheezing noted on exam.  No evidence of secondary infection. Normal phonation, no stridor and no evidence of anaphylaxis. We'll give medications and reassess.  1:47  AM Patient re-evaluated prior to dc, is hemodynamically stable, in no respiratory distress, and denies the feeling of throat closing. Diminished breath sounds improved after albuterol treatments. Pt has been advised to take OTC benadryl & return to the ED if they have a mod-severe allergic rxn (s/s including throat closing, difficulty breathing, swelling of lips face or tongue). Pt is to follow up with their PCP. Pt is agreeable with plan & verbalizes understanding.  I have personally reviewed patient's vitals, nursing note and any pertinent labs or imaging.  I performed an undressed physical exam.    At this time, it has been determined that no acute conditions requiring further emergency intervention. The patient/guardian have been advised of the diagnosis and plan. I reviewed all labs and imaging including any potential incidental findings. We have discussed signs and symptoms that warrant return to the ED, such as those listed above.  Patient/guardian has voiced understanding and agreed to follow-up with the PCP or specialist in 1 week.   Vital signs are stable at discharge.   BP 131/85  Pulse 85  Temp(Src) 98.5 F (36.9 C) (Oral)  Resp 18  SpO2 100%  LMP 11/26/2013         Dierdre ForthHannah Miana Politte, PA-C 12/13/13 0147

## 2013-12-13 NOTE — ED Provider Notes (Signed)
Medical screening examination/treatment/procedure(s) were performed by non-physician practitioner and as supervising physician I was immediately available for consultation/collaboration.   EKG Interpretation None        Lyanne CoKevin M Jasiel Belisle, MD 12/13/13 [phone]

## 2013-12-13 NOTE — Discharge Instructions (Signed)
1. Medications: prednisone, benadryl, pepcid, albuterol, usual home medications 2. Treatment: rest, drink plenty of fluids,  3. Follow Up: Please followup with the [facility] for further evaluation. If you're allergic reaction returns please return to the emergency department particularly if you feel as if your throat is closing.  Food Allergy A food allergy occurs from eating something you are sensitive to. Food allergies occur in all age groups. It may be passed to you from your parents (heredity).  CAUSES  Some common causes are cow's milk, seafood, eggs, nuts (including peanut butter), wheat, and soybeans. SYMPTOMS  Common problems are:   Swelling around the mouth.  An itchy, red rash.  Hives.  Vomiting.  Diarrhea. Severe allergic reactions are life-threatening. This reaction is called anaphylaxis. It can cause the mouth and throat to swell. This makes it hard to breathe and swallow. In severe reactions, only a small amount of food may be fatal within seconds. HOME CARE INSTRUCTIONS   If you are unsure what caused the reaction, keep a diary of foods eaten and symptoms that followed. Avoid foods that cause reactions.  If hives or rash are present:  Take medicines as directed.  Use an over-the-counter antihistamine (diphenhydramine) to treat hives and itching as needed.  Apply cold compresses to the skin or take baths in cool water. Avoid hot baths or showers. These will increase the redness and itching.  If you are severely allergic:  Hospitalization is often required following a severe reaction.  Wear a medical alert bracelet or necklace that describes the allergy.  Carry your anaphylaxis kit or epinephrine injection with you at all times. Both you and your family members should know how to use this. This can be lifesaving if you have a severe reaction. If epinephrine is used, it is important for you to seek immediate medical care or call your local emergency  services (911 in [place] When the epinephrine wears off, it can be followed by a delayed reaction, which can be fatal.  Replace your epinephrine immediately after use in case of another reaction.  Ask your caregiver for instructions if you have not been taught how to use an epinephrine injection.  Do not drive until medicines used to treat the reaction have worn off, unless approved by your caregiver. SEEK MEDICAL CARE IF:   You suspect a food allergy. Symptoms generally happen within 30 minutes of eating a food.  Your symptoms have not gone away within 2 days. See your caregiver sooner if symptoms are getting worse.  You develop new symptoms.  You want to retest yourself with a food or drink you think causes an allergic reaction. Never do this if an anaphylactic reaction to that food or drink has happened before.  There is a return of the symptoms which brought you to your caregiver. SEEK IMMEDIATE MEDICAL CARE IF:   You have trouble breathing, are wheezing, or you have a tight feeling in your chest or throat.  You have a swollen mouth, or you have hives, swelling, or itching all over your body. Use your epinephrine injection immediately. This is given into the outside of your thigh, deep into the muscle. Following use of the epinephrine injection, seek help right away. Seek immediate medical care or call your local emergency services (911 in [place] MAKE SURE YOU:   Understand these instructions.  Will watch your condition.  Will get help right away if you are not doing well or get worse. Document Released: 05/08/2000 Document Revised: 08/03/2011  Document Reviewed: 12/29/2007 [facility] Patient Information 2015 [place], [place]. This information is not intended to replace advice given to you by your health care provider. Make sure you discuss any questions you have with your health care provider.

## 2013-12-13 NOTE — ED Notes (Signed)
Patient is alert and oriented x3.  She was given DC instructions and follow up visit instructions.  Patient gave verbal understanding. She was DC ambulatory under her own power to home.  V/S stable.  He was not showing any signs of distress on DC 

## 2015-03-22 ENCOUNTER — Encounter (HOSPITAL_COMMUNITY): Payer: Self-pay

## 2015-03-22 ENCOUNTER — Emergency Department (HOSPITAL_COMMUNITY)
Admission: EM | Admit: 2015-03-22 | Discharge: 2015-03-22 | Disposition: A | Discharge status: Home / Self Care | Payer: Self-pay | Attending: Physician Assistant | Admitting: Physician Assistant

## 2015-03-22 DIAGNOSIS — Z720 Tobacco use: Secondary | ICD-10-CM | POA: Insufficient documentation

## 2015-03-22 DIAGNOSIS — L299 Pruritus, unspecified: Secondary | ICD-10-CM | POA: Insufficient documentation

## 2015-03-22 DIAGNOSIS — Y929 Unspecified place or not applicable: Secondary | ICD-10-CM | POA: Insufficient documentation

## 2015-03-22 DIAGNOSIS — X58XXXA Exposure to other specified factors, initial encounter: Secondary | ICD-10-CM | POA: Insufficient documentation

## 2015-03-22 DIAGNOSIS — Y999 Unspecified external cause status: Secondary | ICD-10-CM | POA: Insufficient documentation

## 2015-03-22 DIAGNOSIS — J45901 Unspecified asthma with (acute) exacerbation: Secondary | ICD-10-CM | POA: Insufficient documentation

## 2015-03-22 DIAGNOSIS — Z79899 Other long term (current) drug therapy: Secondary | ICD-10-CM | POA: Insufficient documentation

## 2015-03-22 DIAGNOSIS — Y939 Activity, unspecified: Secondary | ICD-10-CM | POA: Insufficient documentation

## 2015-03-22 DIAGNOSIS — T7840XA Allergy, unspecified, initial encounter: Secondary | ICD-10-CM | POA: Insufficient documentation

## 2015-03-22 MED ORDER — IPRATROPIUM-ALBUTEROL 0.5-2.5 (3) MG/3ML IN SOLN
3.0000 mL | Freq: Once | RESPIRATORY_TRACT | Status: AC
  Administered 2015-03-22: 3 mL via RESPIRATORY_TRACT
  Filled 2015-03-22: qty 3

## 2015-03-22 MED ORDER — METHYLPREDNISOLONE SODIUM SUCC 125 MG IJ SOLR
125.0000 mg | Freq: Once | INTRAMUSCULAR | Status: AC
  Administered 2015-03-22: 125 mg via INTRAVENOUS
  Filled 2015-03-22: qty 2

## 2015-03-22 MED ORDER — FAMOTIDINE IN NACL 20-0.9 MG/50ML-% IV SOLN
20.0000 mg | Freq: Once | INTRAVENOUS | Status: AC
  Administered 2015-03-22: 20 mg via INTRAVENOUS
  Filled 2015-03-22: qty 50

## 2015-03-22 MED ORDER — DIPHENHYDRAMINE HCL 50 MG/ML IJ SOLN
50.0000 mg | Freq: Once | INTRAMUSCULAR | Status: AC
  Administered 2015-03-22: 50 mg via INTRAVENOUS
  Filled 2015-03-22: qty 1

## 2015-03-22 MED ORDER — SODIUM CHLORIDE 0.9 % IV BOLUS (SEPSIS)
1000.0000 mL | Freq: Once | INTRAVENOUS | Status: AC
  Administered 2015-03-22: 1000 mL via INTRAVENOUS

## 2015-03-22 MED ORDER — EPINEPHRINE 0.3 MG/0.3ML IJ SOAJ: 0.3000 mg | Freq: Once | INTRAMUSCULAR | Status: AC

## 2015-03-22 MED ORDER — DIPHENHYDRAMINE HCL 25 MG PO TABS: 25.0000 mg | ORAL_TABLET | Freq: Four times a day (QID) | ORAL | Status: DC | PRN

## 2015-03-22 MED ORDER — PREDNISONE 10 MG PO TABS: 40.0000 mg | ORAL_TABLET | Freq: Every day | ORAL | Status: DC

## 2015-03-22 MED ORDER — FAMOTIDINE 20 MG PO TABS: 20.0000 mg | ORAL_TABLET | Freq: Two times a day (BID) | ORAL | Status: DC

## 2015-03-22 NOTE — Discharge Instructions (Signed)
Please take benadryl around the clock every 6 hours for the next day. Please return for any worsening symtpoms or concerns.

## 2015-03-22 NOTE — ED Notes (Signed)
Pt took one benedryl before leaving the house

## 2015-03-22 NOTE — ED Notes (Signed)
Pt woke up about one hour ago with an allergic reaction to something, she is having trouble breathing and she has hives on her face, chest and arms

## 2015-03-22 NOTE — ED Provider Notes (Addendum)
CSN: [id number]     Arrival date & time 03/22/15  [id number] History  By signing my name below, I, [name], attest that this documentation has been prepared under the direction and in the presence of Kallon Caylor Randall AnLyn Tramain Gershman, MD . Electronically Signed: [name], Scribe. 03/22/2015. 3:53 AM.  Chief Complaint  Patient presents with  . Allergic Reaction   The history is provided by the patient. No language interpreter was used.   HPI Comments:  Karla Estrada is a 29 y.o. female with a history of asthma,  who presents to the Emergency Department complaining of mild difficulty breathing and wheezing that began this AM ~ 1 hour ago when she was getting ready for bed. She reports associated puritius to her BLE and BUE. Pt has a h/o same ~ 1 year ago; she was diagnosed with allergic reaction and states she never determined the cause of the reaction. She has taken benadryl PTA with minimal relief. Pt is tolerating secretions but notes taking the benadryl was more difficult than usual.  No tongue edema or facial swelling.   Past Medical History  Diagnosis Date  . Asthma    Past Surgical History  Procedure Laterality Date  . Cesarean section     History reviewed. No pertinent family history. Social History  Substance Use Topics  . Smoking status: Current Every Day Smoker -- 0.50 packs/day    Types: Cigarettes  . Smokeless tobacco: None  . Alcohol Use: Yes     Comment: rare   OB History    No data available     Review of Systems  Constitutional: Negative for fever and chills.  Respiratory: Positive for wheezing.   Skin:       + Pruritus BUE and BLE  All other systems reviewed and are negative.  Allergies  Review of patient's allergies indicates no known allergies.  Home Medications   Prior to Admission medications   Medication Sig Start Date End Date Taking? Authorizing Provider  diphenhydrAMINE (BENADRYL) 25 mg capsule Take 25 mg by mouth every 6 (six) hours as needed for  itching.    Yes Historical Provider, MD  naproxen sodium (ANAPROX) 220 MG tablet Take 440 mg by mouth 2 (two) times daily with a meal.   Yes Historical Provider, MD  diphenhydrAMINE (BENADRYL) 25 MG tablet Take 1 tablet (25 mg total) by mouth every 6 (six) hours as needed for itching (Rash). Patient not taking: Reported on 03/22/2015 12/13/13   Dahlia ClientHannah Muthersbaugh, PA-C  famotidine (PEPCID) 20 MG tablet Take 1 tablet (20 mg total) by mouth 2 (two) times daily. Patient not taking: Reported on 03/22/2015 12/13/13   Dahlia ClientHannah Muthersbaugh, PA-C  predniSONE (DELTASONE) 10 MG tablet Take 4 tablets (40 mg total) by mouth daily. Patient not taking: Reported on 03/22/2015 12/13/13   Dahlia ClientHannah Muthersbaugh, PA-C   BP 125/80 mmHg  Pulse 117  Temp(Src) 98.7 F (37.1 C) (Oral)  Resp 24  SpO2 97% Physical Exam  Constitutional: She is oriented to person, place, and time. She appears well-developed and well-nourished. No distress.  HENT:  Head: Normocephalic and atraumatic.  Eyes: Conjunctivae are normal.  Cardiovascular: Normal rate.   Pulmonary/Chest: Effort normal and breath sounds normal. She has no wheezes (No wheezing on ascultation ).  Abdominal: She exhibits no distension.  Neurological: She is alert and oriented to person, place, and time.  Skin: Skin is warm and dry.  Psychiatric: She has a normal mood and affect.  Nursing note and vitals reviewed.  ED Course  Procedures   DIAGNOSTIC STUDIES:  Oxygen Saturation is 97% on RA, normal by my interpretation.    COORDINATION OF CARE:  3:47 AM Discussed treatment plan with pt at bedside and pt agreed to plan.  Labs Review Labs Reviewed - No data to display  Imaging Review No results found. I have personally reviewed and evaluated these images and lab results as part of my medical decision-making.   EKG Interpretation None      MDM   Final diagnoses:  None    Patient is a 29 year old female presenting with allergic reaction.  Patient has subjective feeling of wheezing. And itching. Patient got no hives visible in skin. We will give Solu-Medrol, famotidine, Benadryl. Currently there is no swelling in her oropharynx. Will monitor. Patient had this reaction before. She's unsure what the trigger is. We'll treat symptomatically.  I personally performed the services described in this documentation, which was scribed in my presence. The recorded information has been reviewed and is accurate.     Patietn appears well. Understands return precautions. No wheezing, difficulty breathing, feels at baseline.   [name], MD 03/22/15 1610  Abelino Derrick, MD 03/22/15 9604

## 2015-06-10 ENCOUNTER — Emergency Department (INDEPENDENT_AMBULATORY_CARE_PROVIDER_SITE_OTHER): Payer: Self-pay

## 2015-06-10 ENCOUNTER — Encounter (HOSPITAL_COMMUNITY): Payer: Self-pay

## 2015-06-10 ENCOUNTER — Emergency Department (INDEPENDENT_AMBULATORY_CARE_PROVIDER_SITE_OTHER)
Admission: EM | Admit: 2015-06-10 | Discharge: 2015-06-10 | Disposition: A | Discharge status: Home / Self Care | Payer: Self-pay | Source: Home / Self Care | Attending: Family Medicine | Admitting: Family Medicine

## 2015-06-10 DIAGNOSIS — S99921A Unspecified injury of right foot, initial encounter: Secondary | ICD-10-CM

## 2015-06-10 NOTE — ED Provider Notes (Signed)
CSN: [id number]     Arrival date & time 06/10/15  1929 History   First MD Initiated Contact with Patient 06/10/15 2006     Chief Complaint  Patient presents with  . Foot Pain   (Consider location/radiation/quality/duration/timing/severity/associated sxs/prior Treatment) HPI Dropped case of meat on foot yesterday, c/o worsening pain, some difficulty with ambulation, no home treatment Past Medical History  Diagnosis Date  . Asthma    Past Surgical History  Procedure Laterality Date  . Cesarean section     No family history on file. Social History  Substance Use Topics  . Smoking status: Current Every Day Smoker -- 0.50 packs/day    Types: Cigarettes  . Smokeless tobacco: None  . Alcohol Use: Yes     Comment: rare   OB History    No data available     Review of Systems ROS +'ve right foot injury  Denies: HEADACHE, NAUSEA, ABDOMINAL PAIN, CHEST PAIN, CONGESTION, DYSURIA, SHORTNESS OF BREATH  Allergies  Review of patient's allergies indicates no known allergies.  Home Medications   Prior to Admission medications   Medication Sig Start Date End Date Taking? Authorizing Provider  diphenhydrAMINE (BENADRYL) 25 mg capsule Take 25 mg by mouth every 6 (six) hours as needed for itching.     Historical Provider, MD  diphenhydrAMINE (BENADRYL) 25 MG tablet Take 1 tablet (25 mg total) by mouth every 6 (six) hours as needed for itching (Rash). 03/22/15   Courteney Lyn Mackuen, MD  EPINEPHrine 0.3 mg/0.3 mL IJ SOAJ injection Inject 0.3 mLs (0.3 mg total) into the muscle once. 03/22/15   Courteney Lyn Mackuen, MD  famotidine (PEPCID) 20 MG tablet Take 1 tablet (20 mg total) by mouth 2 (two) times daily. 03/22/15   Courteney Lyn Mackuen, MD  naproxen sodium (ANAPROX) 220 MG tablet Take 440 mg by mouth 2 (two) times daily with a meal.    Historical Provider, MD  predniSONE (DELTASONE) 10 MG tablet Take 4 tablets (40 mg total) by mouth daily. 03/22/15   Courteney Lyn Mackuen, MD   Meds  Ordered and Administered this Visit  Medications - No data to display  BP 125/74 mmHg  Pulse 84  Temp(Src) 98 F (36.7 C) (Oral)  Resp 16  SpO2 100%  LMP 05/22/2015 No data found.   Physical Exam  Constitutional: She appears well-developed and well-nourished.  Musculoskeletal: She exhibits tenderness.       Right foot: There is tenderness and bony tenderness. There is no swelling, no crepitus and no deformity.       Feet:  Vitals reviewed.   ED Course  Procedures (including critical care time)  Labs Review Labs Reviewed - No data to display  Imaging Review Dg Foot Complete Right  06/10/2015  CLINICAL DATA:  Right foot pain status post dropping heavy object on her foot. EXAM: RIGHT FOOT COMPLETE - 3+ VIEW COMPARISON:  None. FINDINGS: There is no evidence of fracture or dislocation. There is no evidence of arthropathy or other focal bone abnormality. Soft tissues are unremarkable. IMPRESSION: Negative. Electronically Signed   By: Ted Mcalpine M.D.   On: 06/10/2015 20:28     Visual Acuity Review  Right Eye Distance:   Left Eye Distance:   Bilateral Distance:    Right Eye Near:   Left Eye Near:    Bilateral Near:       Reviewed xrays with patient.   MDM   1. Foot injury, right, initial encounter     symptomatic treatment at  home Follow up as needed.     Tharon AquasFrank C Patrick, PA 06/10/15 2038

## 2015-06-10 NOTE — ED Notes (Signed)
Patient states she was at work last night and dropped a box Of frozen meat to the top of her right foot Having some pain and discomfort

## 2015-06-10 NOTE — Discharge Instructions (Signed)
There are no signs of a broken foot.  Treat your pain symptomatically  Apply cold compress several times a day to help with swelling and pain  Tylenol 650 mg every 4 hours or 2 xtra strength tablets every 4 hours or ibuprofen 400-600 mg every 6 hours  Activity as tolerated  Return [employer] work [employer]

## 2015-08-22 ENCOUNTER — Encounter: Payer: Self-pay | Admitting: Family Medicine

## 2015-08-22 ENCOUNTER — Ambulatory Visit (INDEPENDENT_AMBULATORY_CARE_PROVIDER_SITE_OTHER): Payer: BLUE CROSS/BLUE SHIELD | Admitting: Family Medicine

## 2015-08-22 VITALS — BP 116/78 | HR 76 | Ht 63.0 in | Wt 284.8 lb

## 2015-08-22 DIAGNOSIS — G479 Sleep disorder, unspecified: Secondary | ICD-10-CM | POA: Diagnosis not present

## 2015-08-22 DIAGNOSIS — R0683 Snoring: Secondary | ICD-10-CM | POA: Diagnosis not present

## 2015-08-22 DIAGNOSIS — K5909 Other constipation: Secondary | ICD-10-CM | POA: Diagnosis not present

## 2015-08-22 DIAGNOSIS — Z0000 Encounter for general adult medical examination without abnormal findings: Secondary | ICD-10-CM | POA: Diagnosis not present

## 2015-08-22 DIAGNOSIS — Z23 Encounter for immunization: Secondary | ICD-10-CM

## 2015-08-22 LAB — CBC WITH DIFFERENTIAL/PLATELET
BASOS ABS: 0 10*3/uL (ref 0.0–0.1)
BASOS PCT: 0 % (ref 0–1)
EOS ABS: 0.2 10*3/uL (ref 0.0–0.7)
EOS PCT: 2 % (ref 0–5)
HCT: 38.7 % (ref 36.0–46.0)
Hemoglobin: 12.8 g/dL (ref 12.0–15.0)
Lymphocytes Relative: 27 % (ref 12–46)
Lymphs Abs: 3.1 10*3/uL (ref 0.7–4.0)
MCH: 29.2 pg (ref 26.0–34.0)
MCHC: 33.1 g/dL (ref 30.0–36.0)
MCV: 88.2 fL (ref 78.0–100.0)
MPV: 10.2 fL (ref 8.6–12.4)
Monocytes Absolute: 0.8 10*3/uL (ref 0.1–1.0)
Monocytes Relative: 7 % (ref 3–12)
NEUTROS PCT: 64 % (ref 43–77)
Neutro Abs: 7.4 10*3/uL (ref 1.7–7.7)
PLATELETS: 327 10*3/uL (ref 150–400)
RBC: 4.39 MIL/uL (ref 3.87–5.11)
RDW: 12.7 % (ref 11.5–15.5)
WBC: 11.6 10*3/uL — AB (ref 4.0–10.5)

## 2015-08-22 LAB — COMPREHENSIVE METABOLIC PANEL
ALK PHOS: 83 U/L (ref 33–115)
ALT: 12 U/L (ref 6–29)
AST: 15 U/L (ref 10–30)
Albumin: 4.3 g/dL (ref 3.6–5.1)
BUN: 12 mg/dL (ref 7–25)
CHLORIDE: 104 mmol/L (ref 98–110)
CO2: 23 mmol/L (ref 20–31)
Calcium: 9.5 mg/dL (ref 8.6–10.2)
Creat: 0.84 mg/dL (ref 0.50–1.10)
GLUCOSE: 82 mg/dL (ref 65–99)
POTASSIUM: 3.7 mmol/L (ref 3.5–5.3)
Sodium: 139 mmol/L (ref 135–146)
TOTAL PROTEIN: 7.7 g/dL (ref 6.1–8.1)
Total Bilirubin: 0.5 mg/dL (ref 0.2–1.2)

## 2015-08-22 LAB — POCT URINALYSIS DIPSTICK
Bilirubin, UA: NEGATIVE
Glucose, UA: NEGATIVE
Ketones, UA: NEGATIVE
LEUKOCYTES UA: NEGATIVE
NITRITE UA: NEGATIVE
PH UA: 6
PROTEIN UA: NEGATIVE
Spec Grav, UA: >=1.03
UROBILINOGEN UA: NEGATIVE

## 2015-08-22 LAB — TSH: TSH: 1.98 mIU/L

## 2015-08-22 LAB — LIPID PANEL
CHOL/HDL RATIO: 2.7 ratio (ref <=5.0)
Cholesterol: 157 mg/dL (ref 125–200)
HDL: 59 mg/dL (ref >=46)
LDL CALC: 87 mg/dL (ref <130)
Triglycerides: 55 mg/dL (ref <150)
VLDL: 11 mg/dL (ref <30)

## 2015-08-22 NOTE — Patient Instructions (Addendum)
Try Miralax- start with 1 cap full in 8 ounces and titrate until your stools are softer and more frequent. Get more physical activity, continue drinking water and eating a fair amount of fiber.  I am referring you to nutritionist - they will call you, check with your insurance to see your co-pay I am referring you for sleep study.  Schedule an eye exam since you have never had one. Check with insurance first though.    Preventative Care for Adults - Female      MAINTAIN REGULAR HEALTH EXAMS:  A routine yearly physical is a good way to check in with your primary care provider about your health and preventive screening. It is also an opportunity to share updates about your health and any concerns you have, and receive a thorough all-over exam.   Most health insurance companies pay for at least some preventative services.  Check with your health plan for specific coverages.  WHAT PREVENTATIVE SERVICES DO WOMEN NEED?  Adult women should have their weight and blood pressure checked regularly.   Women age 55 and older should have their cholesterol levels checked regularly.  Women should be screened for cervical cancer with a Pap smear and pelvic exam beginning at either age 62, or 3 years after they become sexually activity.    Breast cancer screening generally begins at age 13 with a mammogram and breast exam by your primary care provider.    Beginning at age 11 and continuing to age 16, women should be screened for colorectal cancer.  Certain people may need continued testing until age 96.  Updating vaccinations is part of preventative care.  Vaccinations help protect against diseases such as the flu.  Osteoporosis is a disease in which the bones lose minerals and strength as we age. Women ages 13 and over should discuss this with their caregivers, as should women after menopause who have other risk factors.  Lab tests are generally done as part of preventative care to screen for anemia and  blood disorders, to screen for problems with the kidneys and liver, to screen for bladder problems, to check blood sugar, and to check your cholesterol level.  Preventative services generally include counseling about diet, exercise, avoiding tobacco, drugs, excessive alcohol consumption, and sexually transmitted infections.    GENERAL RECOMMENDATIONS FOR GOOD HEALTH:  Healthy diet:  Eat a variety of foods, including fruit, vegetables, animal or vegetable protein, such as meat, fish, chicken, and eggs, or beans, lentils, tofu, and grains, such as rice.  Drink plenty of water daily.  Decrease saturated fat in the diet, avoid lots of red meat, processed foods, sweets, fast foods, and fried foods.  Exercise:  Aerobic exercise helps maintain good heart health. At least 30-40 minutes of moderate-intensity exercise is recommended. For example, a brisk walk that increases your heart rate and breathing. This should be done on most days of the week.   Find a type of exercise or a variety of exercises that you enjoy so that it becomes a part of your daily life.  Examples are running, walking, swimming, water aerobics, and biking.  For motivation and support, explore group exercise such as aerobic class, spin class, Zumba, Yoga,or  martial arts, etc.    Set exercise goals for yourself, such as a certain weight goal, walk or run in a race such as a 5k walk/run.  Speak to your primary care provider about exercise goals.  Disease prevention:  If you smoke or chew tobacco, find out  from your caregiver how to quit. It can literally save your life, no matter how long you have been a tobacco user. If you do not use tobacco, never begin.   Maintain a healthy diet and normal weight. Increased weight leads to problems with blood pressure and diabetes.   The Body Mass Index or BMI is a way of measuring how much of your body is fat. Having a BMI above 27 increases the risk of heart disease, diabetes,  hypertension, stroke and other problems related to obesity. Your caregiver can help determine your BMI and based on it develop an exercise and dietary program to help you achieve or maintain this important measurement at a healthful level.  High blood pressure causes heart and blood vessel problems.  Persistent high blood pressure should be treated with medicine if weight loss and exercise do not work.   Fat and cholesterol leaves deposits in your arteries that can block them. This causes heart disease and vessel disease elsewhere in your body.  If your cholesterol is found to be high, or if you have heart disease or certain other medical conditions, then you may need to have your cholesterol monitored frequently and be treated with medication.   Ask if you should have a cardiac stress test if your history suggests this. A stress test is a test done on a treadmill that looks for heart disease. This test can find disease prior to there being a problem.  Menopause can be associated with physical symptoms and risks. Hormone replacement therapy is available to decrease these. You should talk to your caregiver about whether starting or continuing to take hormones is right for you.   Osteoporosis is a disease in which the bones lose minerals and strength as we age. This can result in serious bone fractures. Risk of osteoporosis can be identified using a bone density scan. Women ages 88 and over should discuss this with their caregivers, as should women after menopause who have other risk factors. Ask your caregiver whether you should be taking a calcium supplement and Vitamin D, to reduce the rate of osteoporosis.   Avoid drinking alcohol in excess (more than two drinks per day).  Avoid use of street drugs. Do not share needles with anyone. Ask for professional help if you need assistance or instructions on stopping the use of alcohol, cigarettes, and/or drugs.  Brush your teeth twice a day with fluoride  toothpaste, and floss once a day. Good oral hygiene prevents tooth decay and gum disease. The problems can be painful, unattractive, and can cause other health problems. Visit your dentist for a routine oral and dental check up and preventive care every 6-12 months.   Look at your skin regularly.  Use a mirror to look at your back. Notify your caregivers of changes in moles, especially if there are changes in shapes, colors, a size larger than a pencil eraser, an irregular border, or development of new moles.  Safety:  Use seatbelts 100% of the time, whether driving or as a passenger.  Use safety devices such as hearing protection if you work in environments with loud noise or significant background noise.  Use safety glasses when doing any work that could send debris in to the eyes.  Use a helmet if you ride a bike or motorcycle.  Use appropriate safety gear for contact sports.  Talk to your caregiver about gun safety.  Use sunscreen with a SPF (or skin protection factor) of 15 or greater.  Lighter  skinned people are at a greater risk of skin cancer. Don't forget to also wear sunglasses in order to protect your eyes from too much damaging sunlight. Damaging sunlight can accelerate cataract formation.   Practice safe sex. Use condoms. Condoms are used for birth control and to help reduce the spread of sexually transmitted infections (or STIs).  Some of the STIs are gonorrhea (the clap), chlamydia, syphilis, trichomonas, herpes, HPV (human papilloma virus) and HIV (human immunodeficiency virus) which causes AIDS. The herpes, HIV and HPV are viral illnesses that have no cure. These can result in disability, cancer and death.   Keep carbon monoxide and smoke detectors in your home functioning at all times. Change the batteries every 6 months or use a model that plugs into the wall.   Vaccinations:  Stay up to date with your tetanus shots and other required immunizations. You should have a booster for  tetanus every 10 years. Be sure to get your flu shot every year, since 5%-20% of the [place] population comes down with the flu. The flu vaccine changes each year, so being vaccinated once is not enough. Get your shot in the fall, before the flu season peaks.   Other vaccines to consider:  Human Papilloma Virus or HPV causes cancer of the cervix, and other infections that can be transmitted from person to person. There is a vaccine for HPV, and females should get immunized between the ages of 3611 and 6726. It requires a series of 3 shots.   Pneumococcal vaccine to protect against certain types of pneumonia.  This is normally recommended for adults age 29 or older.  However, adults younger than 29 years old with certain underlying conditions such as diabetes, heart or lung disease should also receive the vaccine.  Shingles vaccine to protect against Varicella Zoster if you are older than age 29, or younger than 29 years old with certain underlying illness.  Hepatitis A vaccine to protect against a form of infection of the liver by a virus acquired from food.  Hepatitis B vaccine to protect against a form of infection of the liver by a virus acquired from blood or body fluids, particularly if you work in health care.  If you plan to travel internationally, check with your local health department for specific vaccination recommendations.  Cancer Screening:  Breast cancer screening is essential to preventive care for women. All women age 29 and older should perform a breast self-exam every month. At age 29 and older, women should have their caregiver complete a breast exam each year. Women at ages 2640 and older should have a mammogram (x-ray film) of the breasts. Your caregiver can discuss how often you need mammograms.    Cervical cancer screening includes taking a Pap smear (sample of cells examined under a microscope) from the cervix (end of the uterus). It also includes testing for HPV (Human Papilloma  Virus, which can cause cervical cancer). Screening and a pelvic exam should begin at age 29, or 3 years after a woman becomes sexually active. Screening should occur every year, with a Pap smear but no HPV testing, up to age 29. After age 29, you should have a Pap smear every 3 years with HPV testing, if no HPV was found previously.   Most routine colon cancer screening begins at the age of 29. On a yearly basis, doctors may provide special easy to use take-home tests to check for hidden blood in the stool. Sigmoidoscopy or colonoscopy can detect the earliest  forms of colon cancer and is life saving. These tests use a small camera at the end of a tube to directly examine the colon. Speak to your caregiver about this at age 63, when routine screening begins (and is repeated every 5 years unless early forms of pre-cancerous polyps or small growths are found).

## 2015-08-22 NOTE — Progress Notes (Signed)
Subjective:    Patient ID: Karla Estrada, female    DOB: [birth year], 30 y.o.   MRN: [id number]  HPI Chief Complaint  Patient presents with  . new pt    new pt cpe, pap smear was 2 years ago and normal, having irregular bowel movements- maybe once a week and conspitation, snoring at night for about 5 months   She is new to the practice and here for a complete physical exam. Previous care has been at the health department or local urgent cares.  concerns today: she is waking herself up snoring and seems to have gotten worse in past 6 months. Her partner states her breathing is "funny sounding" and may stop breathing but is not sure. She is waking up multiple times in the night. She does not feel rested, wants to sleep all day, falls asleep when sitting still. Denies history of sleep apnea.  States she has a vaginal odor but this has been present for as long as she can recall and even after being treated on several occasions for BV.  She denies need for STI testing. Denies vaginal discharge, irritation, itching.   States she has had irregular bowel movements over past 8 months or so has tried drinking more water, increasing fiber in diet, activia yogurt. Has a bowel movement about 1 x per week and it is firm and she has to strain. She occasionally takes a laxative.  She reports weight gain of 35 to 40 lbs over past 6 months and she states she stopped smoking about 6 months ago but is not eating more than she was.   Last physical exam: unknown Last Pap smear: October 2014- normal. She states she has never had abnormal pap smear.  LMP: 08/14/2015 Birth control: in same sex relationship Eye exam: never Dental exam: last year Mammogram: never Colonoscopy: never  Lives with spouse and son 32 years old  Works as International aid/development worker at [employer]  Smoking- former- quit 6 months ago, drinking alcohol- socially and drug use- never.  Wear seatbelt always, smoke detectors in home and functioning, no  guns in home. Feels safe in home environment.   Review of Systems Review of Systems Constitutional: -fever, -chills, -sweats, +unexpected weight change,-fatigue ENT: -runny nose, -ear pain, -sore throat Cardiology:  -chest pain, -palpitations, -edema Respiratory: -cough, -shortness of breath, -wheezing Gastroenterology: -abdominal pain, -nausea, -vomiting, -diarrhea, +constipation  Hematology: -bleeding or bruising problems Musculoskeletal: -arthralgias, -myalgias, -joint swelling, -back pain Ophthalmology: -vision changes Urology: -dysuria, -difficulty urinating, -hematuria, -urinary frequency, -urgency Neurology: -headache, -weakness, -tingling, -numbness       Objective:   Physical Exam BP 116/78 mmHg  Pulse 76  Ht  (1.6 m)  Wt 284 lb 12.8 oz (129.184 kg)  BMI 50.46 kg/m2  LMP 08/19/2015  General Appearance:    Alert, cooperative, no distress, appears stated age  Head:    Normocephalic, without obvious abnormality, atraumatic  Eyes:    PERRL, conjunctiva/corneas clear, EOM's intact, fundi    benign  Ears:    Normal TM's and external ear canals  Nose:   Nares normal, mucosa normal, no drainage or sinus   tenderness  Throat:   Lips, mucosa, and tongue normal; teeth and gums normal  Neck:   Supple, no lymphadenopathy;  thyroid:  no   enlargement/tenderness/nodules; no carotid   bruit or JVD  Back:    Spine nontender, no curvature, ROM normal, no CVA     tenderness  Lungs:     Clear  to auscultation bilaterally without wheezes, rales or     ronchi; respirations unlabored  Chest Wall:    No tenderness or deformity   Heart:    Regular rate and rhythm, S1 and S2 normal, no murmur, rub   or gallop  Breast Exam:    No tenderness, masses, or nipple discharge or inversion.      No axillary lymphadenopathy  Abdomen:     Soft, non-tender, nondistended, normoactive bowel sounds,    no masses, no hepatosplenomegaly  Genitalia:    Normal external genitalia without lesions.  BUS and  vagina normal; cervix without lesions, or cervical motion tenderness. No abnormal vaginal discharge.  Uterus and adnexa not enlarged, nontender, no masses.  Pap not performed- not due.   Rectal:    Not performed due to age<40 and no related complaints  Extremities:   No clubbing, cyanosis or edema  Pulses:   2+ and symmetric all extremities  Skin:   Skin color, texture, turgor normal, no rashes or lesions  Lymph nodes:   Cervical, supraclavicular, and axillary nodes normal  Neurologic:   CNII-XII intact, normal strength, sensation and gait; reflexes 2+ and symmetric throughout          Psych:   Normal mood, affect, hygiene and grooming.    Epworth sleepiness scale score 12 Urinalysis dipstick: sp grav 1.030, trace blood (menses)     Assessment & Plan:  Routine general medical examination at a health care facility - Plan: POCT urinalysis dipstick, CBC with Differential/Platelet, Comprehensive metabolic panel, TSH, Lipid panel  Morbid obesity, unspecified obesity type (HCC) - Plan: TSH, Lipid panel, VITAMIN D 25 Hydroxy (Vit-D Deficiency, Fractures), Amb ref to Medical Nutrition Therapy-MNT  Snoring  Sleep disturbance  Other constipation - Plan: TSH  Need for Tdap vaccination - Plan: Tdap vaccine greater than or equal to 7yo IM  Will look for underlying physiological explanation for recent weight gain. Referral made to nutritionist and she will contact her insurance company to find out what her co-pay will be before she goes to the appointment. Discussed portion control and eating small frequent meals throughout the day. Also discussed getting a minimum of 150 minutes of physical activity per week. Discussed that her symptoms make me suspicious for sleep apnea, her Epworth sleepiness score indicates the possibility of a sleep disorder. Referral made for sleep study. Recommend that she continue drinking plenty of water, eating fiber in her diet, and that she should increase her physical  activity. She will try over-the-counter MiraLAX and let me know if this is helping.  tdap given.  Discussed safety and health promotion. Recommend she schedule an appointment for an eye exam since she has never had one. I will follow up pending lab results

## 2015-08-23 LAB — VITAMIN D 25 HYDROXY (VIT D DEFICIENCY, FRACTURES): Vit D, 25-Hydroxy: 8 ng/mL — ABNORMAL LOW (ref 30–100)

## 2015-08-26 ENCOUNTER — Encounter: Payer: Self-pay | Admitting: Internal Medicine

## 2015-08-26 ENCOUNTER — Telehealth: Payer: Self-pay | Admitting: Internal Medicine

## 2015-08-26 MED ORDER — VITAMIN D (ERGOCALCIFEROL) 1.25 MG (50000 UNIT) PO CAPS: 50000.0000 [IU] | ORAL_CAPSULE | ORAL | Status: DC

## 2015-08-26 NOTE — Telephone Encounter (Signed)
-----   Message from Ronnald NianJohn C Lalonde, MD sent at 08/26/2015 11:18 AM EDT ----- She is vitamin D deficient and needs 50,000 units weekly for the next 8 weeks and schedule a follow-up blood draw.

## 2015-08-26 NOTE — Telephone Encounter (Signed)
Sent in vitamin D rx to pharmacy

## 2015-09-12 ENCOUNTER — Encounter: Payer: BLUE CROSS/BLUE SHIELD | Attending: Family Medicine | Admitting: Dietician

## 2015-09-12 ENCOUNTER — Encounter: Payer: Self-pay | Admitting: Dietician

## 2015-09-12 NOTE — Patient Instructions (Addendum)
Try sweetening coffee and tea with stevia. Aim to eat 3 meals per day when possible, or at least eat something every 3-5 hours you are awake.  Fill half your plate with vegetables when possible (bagged salads or frozen vegetables). Have lean protein the size of the palm of your hand and starch/fruit on a quarter of your plate. If you are unable to eat a meal, try a Premier Protein shake with fruit (carb). Try to do some meal planning/preparing on days off.  Continue walking 3 x week and increase when possible (goal 300 minutes per week).

## 2015-09-12 NOTE — Progress Notes (Signed)
Medical Nutrition Therapy:  Appt start time: 0955 end time:  1035.   Assessment:  Primary concerns today: Karla Estrada is here today since she has gained a lot weight recently (45 lbs in past 8 months). Felt like she was doing better recently and was surprised about weight gain. Has been drinking more water and cutting back on sugary foods. Weight was 240 lbs for 2-3 years and was 221 in 2012. Got off of depo shot in 2014. In the past month has started walking in the evening 3-4 times per week.  Works as a Naval architect. Skips breakfast and lunch if work day shift. And skips dinner if she works night. Will eat fruit for snacks. Eats three meals per day on days off (2x week). Eats out 3-4 meals per week. Has been working this job for 4 years. Lives with her spouse and son. She does the food shopping and meal preparation at home. Started having an issue with snoring and wakes up 2-3 x per night.   Usually hungrier later in the evening. Feels like she eats a normal size portion and will take some food home from a restaurant,   Did some lab work which showed vitamin D deficiency. Taking vitamin D.  Feels like she needs to eat more often and exercise more often. Would like to lose 50-80 lbs slowly.   Preferred Learning Style:   No preference indicated   Learning Readiness:   Ready   MEDICATIONS: see list   DIETARY INTAKE:  Usual eating pattern includes 1-3 meals and 2-3 snacks per day.  Avoided foods include: pork, yogurt, cheese, nuts, peanut butter, beef jerky    24-hr recall:  B ( AM): none usually or cereal or grapefruit or muffin from [place] or egg and cheese on a bun Snk ( AM): fruit  L ( PM): none usually or pineapple and chips or days off-salad or noodles Snk ( PM): fruit or smart pop D ( PM): if working - spaghetti, hamburger helper or salad with chicken from restaurant  Snk ( PM): sweets - little debbie or candy bar, chips, cheetos Beverages: gallon of water, 2 cups  coffee with sweetened creamer and sugar, 16 oz sweet tea  Usual physical activity: 60 minutes walking 3-4 x week  Estimated energy needs: 2000 calories 225 g carbohydrates 150 g protein 56 g fat  Progress Towards Goal(s):  In progress.   Nutritional Diagnosis:  [place]-3.3 Overweight/obesity As related to hx of unstructured meal pattern and excess consumption of carbohydrates.  As evidenced by 45 weight gain in past 8 months.    Intervention:  Nutrition counseling provided. Plan: Try sweetening coffee and tea with stevia. Aim to eat 3 meals per day when possible, or at least eat something every 3-5 hours you are awake.  Fill half your plate with vegetables when possible (bagged salads or frozen vegetables). Have lean protein the size of the palm of your hand and starch/fruit on a quarter of your plate. If you are unable to eat a meal, try a Premier Protein shake with fruit (carb). Try to do some meal planning/preparing on days off.  Continue walking 3 x week and increase when possible (goal 300 minutes per week).   Teaching Method Utilized:  Visual Auditory Hands on  Handouts given during visit include:  15 g CHO snacks  MyPlate handout  Supplements given during visit include:  [employer], lot # [id number], exp 02/25/2016  Strawberry Premier Protein, lot # [id number], exp 02/16/2016  Barriers to learning/adherence to lifestyle change: busy schedule  Demonstrated degree of understanding via:  Teach Back   Monitoring/Evaluation:  Dietary intake, exercise, and body weight prn.

## 2015-10-07 ENCOUNTER — Ambulatory Visit (HOSPITAL_BASED_OUTPATIENT_CLINIC_OR_DEPARTMENT_OTHER): Payer: BLUE CROSS/BLUE SHIELD | Attending: Family Medicine

## 2016-03-26 DIAGNOSIS — Z23 Encounter for immunization: Secondary | ICD-10-CM | POA: Diagnosis not present

## 2016-05-30 ENCOUNTER — Ambulatory Visit (HOSPITAL_COMMUNITY)
Admission: EM | Admit: 2016-05-30 | Discharge: 2016-05-30 | Disposition: A | Discharge status: Home / Self Care | Payer: BLUE CROSS/BLUE SHIELD | Attending: Family Medicine | Admitting: Family Medicine

## 2016-05-30 ENCOUNTER — Encounter (HOSPITAL_COMMUNITY): Payer: Self-pay | Admitting: Emergency Medicine

## 2016-05-30 DIAGNOSIS — J069 Acute upper respiratory infection, unspecified: Secondary | ICD-10-CM

## 2016-05-30 MED ORDER — IPRATROPIUM BROMIDE 0.06 % NA SOLN: 2.0000 | Freq: Four times a day (QID) | NASAL | 1 refills | Status: DC

## 2016-05-30 MED ORDER — GUAIFENESIN-CODEINE 100-10 MG/5ML PO SYRP: 10.0000 mL | ORAL_SOLUTION | Freq: Four times a day (QID) | ORAL | 0 refills | Status: DC | PRN

## 2016-05-30 NOTE — ED Provider Notes (Signed)
[facility]    CSN: [id number] Arrival date & time: 05/30/16  1407     History   Chief Complaint Chief Complaint  Patient presents with  . Cough    HPI Karla Estrada is a 31 y.o. female.   The history is provided by the patient.  Cough  Cough characteristics:  Non-productive Sputum characteristics:  Yellow Severity:  Mild Onset quality:  Gradual Duration:  2 weeks Chronicity:  New Smoker: no   Context: upper respiratory infection and weather changes   Relieved by:  None tried Worsened by:  Nothing Ineffective treatments:  None tried Associated symptoms: rhinorrhea and sinus congestion   Associated symptoms: no fever and no wheezing     Past Medical History:  Diagnosis Date  . Asthma     Patient Active Problem List   Diagnosis Date Noted  . SORE THROAT 07/27/2008    Past Surgical History:  Procedure Laterality Date  . CESAREAN SECTION      OB History    No data available       Home Medications    Prior to Admission medications   Medication Sig Start Date End Date Taking? Authorizing Provider  EPINEPHrine 0.3 mg/0.3 mL IJ SOAJ injection Inject 0.3 mLs (0.3 mg total) into the muscle once. 03/22/15   Courteney Lyn Mackuen, MD    Family History Family History  Problem Relation Age of Onset  . Hypertension Mother     Social History Social History  Substance Use Topics  . Smoking status: Former Smoker    Packs/day: 0.50    Types: Cigarettes  . Smokeless tobacco: Not on file  . Alcohol use 0.0 oz/week     Comment: rare     Allergies   Patient has no known allergies.   Review of Systems Review of Systems  Constitutional: Negative.  Negative for fever.  HENT: Positive for congestion, postnasal drip and rhinorrhea.   Respiratory: Positive for cough. Negative for wheezing.   Cardiovascular: Negative.   Gastrointestinal: Negative.   All other systems reviewed and are negative.    Physical Exam Triage Vital Signs ED  Triage Vitals  Enc Vitals Group     BP 05/30/16 1527 122/83     Pulse Rate 05/30/16 1527 92     Resp 05/30/16 1527 18     Temp 05/30/16 1527 99 F (37.2 C)     Temp Source 05/30/16 1527 Oral     SpO2 05/30/16 1527 100 %     Weight --      Height --      Head Circumference --      Peak Flow --      Pain Score 05/30/16 1531 4     Pain Loc --      Pain Edu? --      Excl. in GC? --    No data found.   Updated Vital Signs BP 122/83 (BP Location: Right Wrist)   Pulse 92   Temp 99 F (37.2 C) (Oral)   Resp 18   LMP 05/13/2016 (Exact Date)   SpO2 100%   Visual Acuity Right Eye Distance:   Left Eye Distance:   Bilateral Distance:    Right Eye Near:   Left Eye Near:    Bilateral Near:     Physical Exam  Constitutional: She is oriented to person, place, and time. She appears well-developed and well-nourished. No distress.  HENT:  Right Ear: External ear normal.  Left Ear: External ear  normal.  Nose: Mucosal edema and rhinorrhea present.  Mouth/Throat: Posterior oropharyngeal erythema present.  Neck: Normal range of motion. Neck supple.  Cardiovascular: Normal rate, regular rhythm, normal heart sounds and intact distal pulses.   Pulmonary/Chest: Effort normal and breath sounds normal.  Abdominal: Soft. Bowel sounds are normal.  Lymphadenopathy:    She has no cervical adenopathy.  Neurological: She is alert and oriented to person, place, and time.  Skin: Skin is warm and dry.  Nursing note and vitals reviewed.    UC Treatments / Results  Labs (all labs ordered are listed, but only abnormal results are displayed) Labs Reviewed - No data to display  EKG  EKG Interpretation None       Radiology No results found.  Procedures Procedures (including critical care time)  Medications Ordered in UC Medications - No data to display   Initial Impression / Assessment and Plan / UC Course  I have reviewed the triage vital signs and the nursing notes.  Pertinent  labs & imaging results that were available during my care of the patient were reviewed by me and considered in my medical decision making (see chart for details).  Clinical Course       Final Clinical Impressions(s) / UC Diagnoses   Final diagnoses:  None    New Prescriptions New Prescriptions   No medications on file     Linna HoffJames D Kindl, MD 06/21/16 1200

## 2016-05-30 NOTE — Discharge Instructions (Signed)
Drink plenty of fluids as discussed, use medicine as prescribed, and mucinex or delsym for cough. Return or see your doctor if further problems °

## 2016-05-30 NOTE — ED Triage Notes (Signed)
The patient presented to the [facility] with a complaint of a cough and congestion x 2 weeks. 

## 2016-07-13 ENCOUNTER — Ambulatory Visit (INDEPENDENT_AMBULATORY_CARE_PROVIDER_SITE_OTHER): Payer: BLUE CROSS/BLUE SHIELD | Admitting: Family Medicine

## 2016-07-13 ENCOUNTER — Encounter: Payer: Self-pay | Admitting: Family Medicine

## 2016-07-13 VITALS — BP 120/80 | HR 97 | Temp 98.0°F | Resp 16 | Ht 63.0 in | Wt 297.2 lb

## 2016-07-13 DIAGNOSIS — B3731 Acute candidiasis of vulva and vagina: Secondary | ICD-10-CM

## 2016-07-13 DIAGNOSIS — B9689 Other specified bacterial agents as the cause of diseases classified elsewhere: Secondary | ICD-10-CM | POA: Diagnosis not present

## 2016-07-13 DIAGNOSIS — N898 Other specified noninflammatory disorders of vagina: Secondary | ICD-10-CM

## 2016-07-13 DIAGNOSIS — B373 Candidiasis of vulva and vagina: Secondary | ICD-10-CM

## 2016-07-13 DIAGNOSIS — J309 Allergic rhinitis, unspecified: Secondary | ICD-10-CM

## 2016-07-13 DIAGNOSIS — N760 Acute vaginitis: Secondary | ICD-10-CM

## 2016-07-13 LAB — POCT WET PREP (WET MOUNT)
Clue Cells Wet Prep Whiff POC: POSITIVE
KOH Wet Prep POC: POSITIVE
Trichomonas Wet Prep HPF POC: ABSENT

## 2016-07-13 MED ORDER — METRONIDAZOLE 500 MG PO TABS: 500.0000 mg | ORAL_TABLET | Freq: Two times a day (BID) | ORAL | 0 refills | Status: DC

## 2016-07-13 MED ORDER — FLUCONAZOLE 150 MG PO TABS: 150.0000 mg | ORAL_TABLET | Freq: Once | ORAL | 0 refills | Status: AC

## 2016-07-13 MED ORDER — FLUTICASONE PROPIONATE 50 MCG/ACT NA SUSP: 2.0000 | Freq: Every day | NASAL | 6 refills | Status: DC

## 2016-07-13 MED ORDER — LORATADINE 10 MG PO TABS: 10.0000 mg | ORAL_TABLET | Freq: Every day | ORAL | 5 refills | Status: DC

## 2016-07-13 NOTE — Patient Instructions (Addendum)
Start using flonase and and taking claritin daily for allergies daily. If this does not help with symptoms after a couple of weeks let me know.   Do not drink alcohol while taking the metronidazole.  Let me know if symptoms do not improve after antibiotic and Diflucan.

## 2016-07-13 NOTE — Progress Notes (Signed)
   Subjective:    Patient ID: Karla Estrada, female    DOB: 02/01/1986, 31 y.o.   MRN: [id number]  HPI Chief Complaint  Patient presents with  . sick    sick- congestion, mucous, drainage, cough. had since the beginning of the year  . BV    ongoing bv, - had amoxcilling beginning of january for infected tooth and thinks it could just be yeast  . weight management    weight management- wants to starts program on weight   She is here with multiple complaints.  Complains of vaginal discharge that is white and thick for the past 2 weeks. States this started after her menstrual cycle was finished.   Denies itching or pain.  States she took Amoxicillin in January for dental issue. States she feels like she may have a yeast infection.  No new sexual partners.  Last sexual encounter was mid January.  Denies using birth control. Same sex marriage.   No abdominal pain, back pain or urinary symptoms.   Complains of ongoing nasal congestion and dry cough. Symptoms started in early January.  In the morning she reports having a productive cough but not throughout the day.  States she went to urgent care and was given cough medication and nasal spray. Presumed allergy related.  States she has not gotten any better or worse.  History of seasonal allergies.  Is not taking an antihistamine.   States she has asthma and was diagnosed in her teens. States triggers are exercise induced. States she has not had an inhaler in a couple of years. States her asthma does not bother her. No asthma flare in early 20s.   Denies fever, chills, rhinorrhea, sinus pain, sore throat, shortness of breath, chest pain, N/V/D.    Reviewed allergies, medications, past medical, surgical, and social history.   Review of Systems Pertinent positives and negatives in the history of present illness.     Objective:   Physical Exam BP 120/80   Pulse 97   Temp 98 F (36.7 C) (Oral)   Resp 16   Ht 5\' 3"  (1.6 m)   Wt  297 lb 3.2 oz (134.8 kg)   SpO2 99%   BMI 52.65 kg/m  Alert and in no distress. No sinus tenderness. Nares with erythema, edema (R>L) and without discharge. Tympanic membranes and canals are normal. Pharyngeal area is normal. Neck is supple without adenopathy or thyromegaly. Cardiac exam shows a regular sinus rhythm without murmurs or gallops. Lungs are clear to auscultation. Abdomen soft, non distended, non tender.  Thin, white adherent discharge in vaginal vault and on cervix.       Assessment & Plan:  Allergic rhinitis, unspecified chronicity, unspecified seasonality, unspecified trigger - Plan: fluticasone (FLONASE) 50 MCG/ACT nasal spray, loratadine (CLARITIN) 10 MG tablet  Vaginal discharge - Plan: POCT Wet Prep (Wet Mount), GC/Chlamydia Probe Amp  BV (bacterial vaginosis) - Plan: metroNIDAZOLE (FLAGYL) 500 MG tablet  Vaginal candidiasis - Plan: fluconazole (DIFLUCAN) 150 MG tablet  Wet mount- pos BV and yeast.  Sent GC/CT swab.  Will treat with metronidazole and diflucan. She will follow up if symptoms are not resolved.  Suspect her URI symptoms are related to allergies. Will start her on daily flonase and claritin and she will follow up if not improving.

## 2016-07-14 LAB — GC/CHLAMYDIA PROBE AMP
CT Probe RNA: NOT DETECTED
GC PROBE AMP APTIMA: NOT DETECTED

## 2016-07-16 IMAGING — DX DG FOOT COMPLETE 3+V*R*
3 series · 3 of 3 positions shown · non-contrast
Comparison: None.

CLINICAL DATA: Right foot pain status post dropping heavy object on
her foot.

EXAM:
RIGHT FOOT COMPLETE - 3+ VIEW

[foot ap]
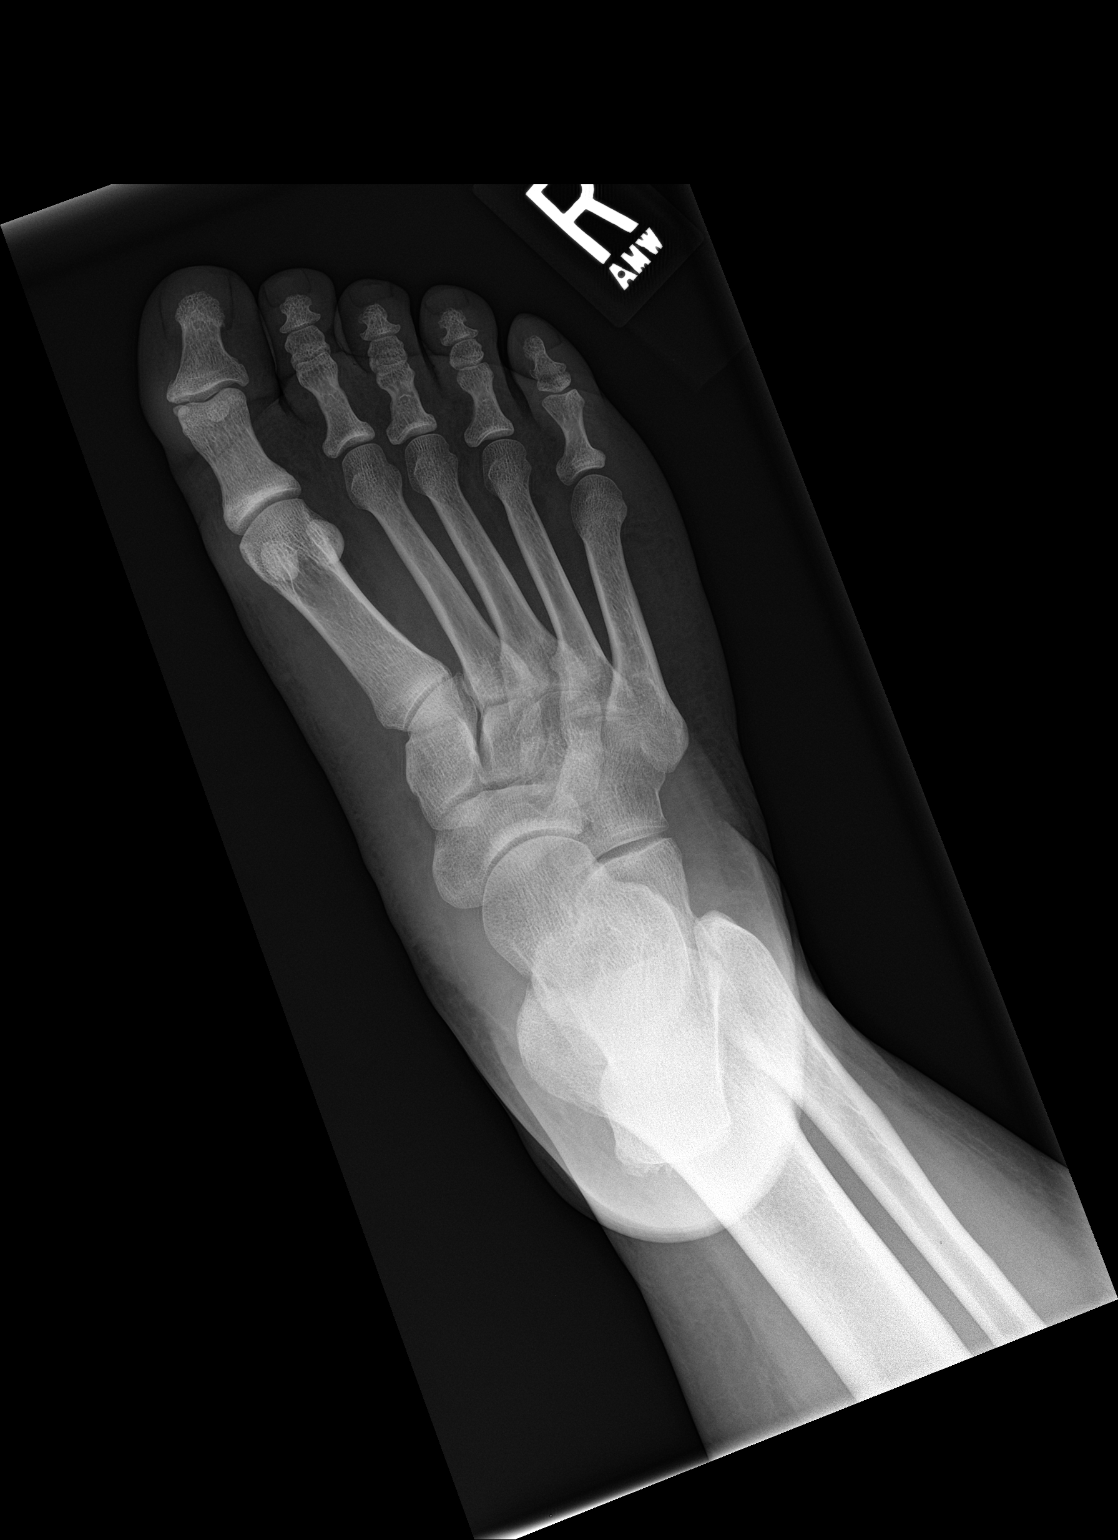

[foot obl]
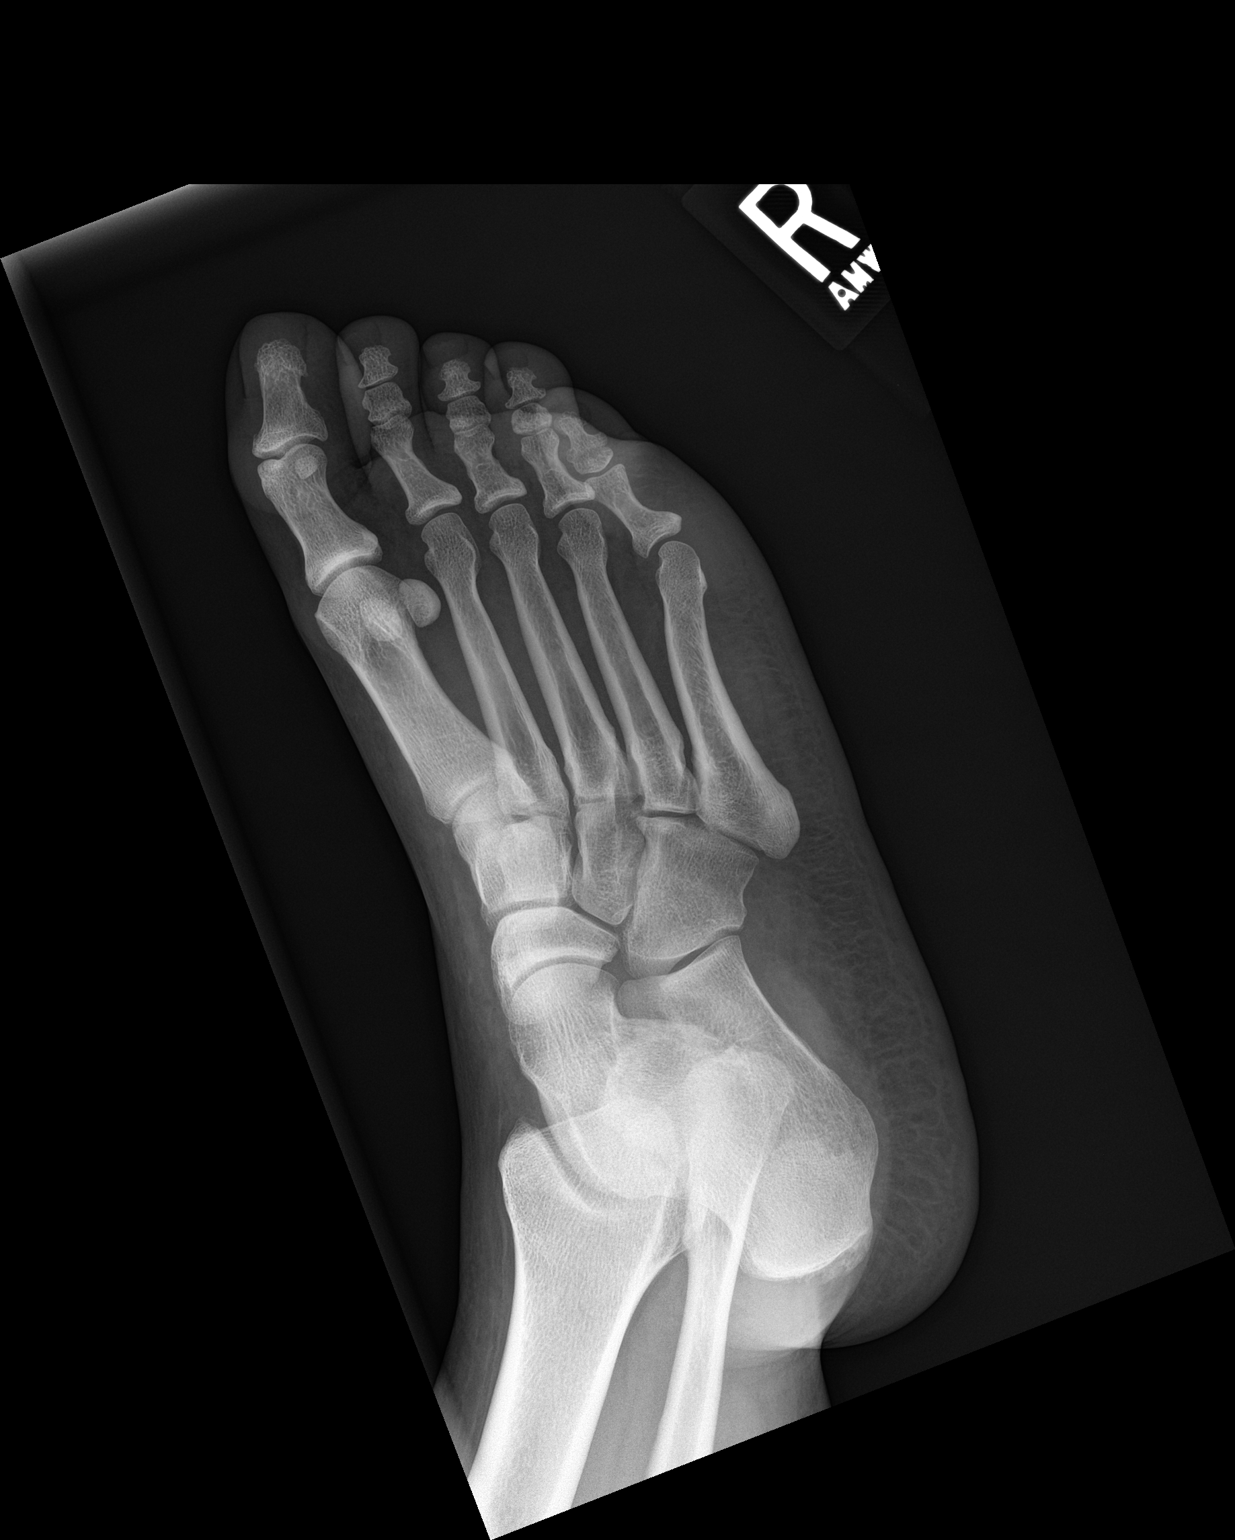

[foot lat]
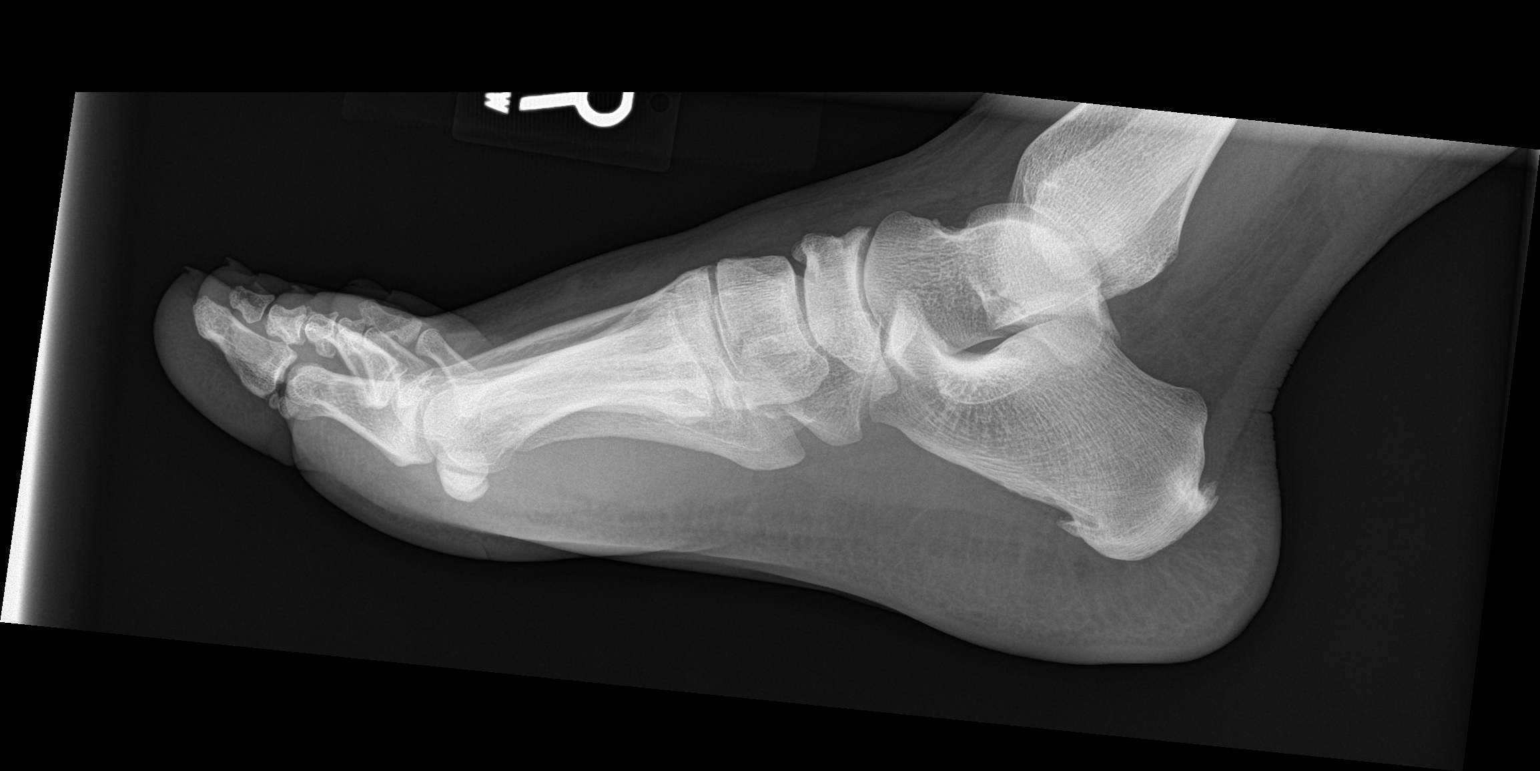

[3 of 3 positions shown; findings below may reference images not displayed]

FINDINGS: There is no evidence of fracture or dislocation. There is no
evidence of arthropathy or other focal bone abnormality. Soft
tissues are unremarkable.
IMPRESSION: Negative.

## 2016-08-31 ENCOUNTER — Telehealth: Payer: Self-pay | Admitting: Family Medicine

## 2016-08-31 NOTE — Telephone Encounter (Signed)
Pt states she took the yeast pill 2 weeks ago and then started flagyl as well 2 weeks ago. She did not take it as soon as you prescribed it too her back mid February. She states it was helping with her symptoms until she got to the last couple pills. Before she started her period last week she was having heavy discharge and odor and then since her period she is still having the heavy discharge and odor. She wants a refill on flagyl and diflucan

## 2016-08-31 NOTE — Telephone Encounter (Signed)
Left message for pt to call back and schedule appt °

## 2016-08-31 NOTE — Telephone Encounter (Signed)
She will need an OV

## 2016-08-31 NOTE — Telephone Encounter (Signed)
Please call and get more information. Specific symptoms please.

## 2016-08-31 NOTE — Telephone Encounter (Signed)
Pt states finished meds & got a little better while taking meds but then as soon as stopped symptoms returned,  period started and finished and symptoms continued.  Would like refills and see if will clear up.

## 2016-08-31 NOTE — Telephone Encounter (Signed)
Left message for pt to call me back 

## 2016-09-16 ENCOUNTER — Ambulatory Visit (HOSPITAL_COMMUNITY)
Admission: EM | Admit: 2016-09-16 | Discharge: 2016-09-16 | Disposition: A | Discharge status: Home / Self Care | Payer: BLUE CROSS/BLUE SHIELD | Attending: Internal Medicine | Admitting: Internal Medicine

## 2016-09-16 ENCOUNTER — Encounter (HOSPITAL_COMMUNITY): Payer: Self-pay | Admitting: Emergency Medicine

## 2016-09-16 DIAGNOSIS — J029 Acute pharyngitis, unspecified: Secondary | ICD-10-CM

## 2016-09-16 DIAGNOSIS — J301 Allergic rhinitis due to pollen: Secondary | ICD-10-CM | POA: Diagnosis not present

## 2016-09-16 MED ORDER — AMOXICILLIN 500 MG PO CAPS: 1000.0000 mg | ORAL_CAPSULE | Freq: Two times a day (BID) | ORAL | 0 refills | Status: DC

## 2016-09-16 NOTE — Discharge Instructions (Signed)
Continue the allergy medicines that you usually take. Drink plenty of cool liquids. Use Cepacol lozenges for sore throat pain and take ibuprofen 600 mg every 6 hours. Take the amoxicillin as directed until all gone. 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

## 2016-09-16 NOTE — ED Triage Notes (Signed)
The patient presented to the [facility] with a complaint of a sore throat with a headache and chills that started this am.

## 2016-09-16 NOTE — ED Provider Notes (Signed)
CSN: [id number]     Arrival date & time 09/16/16  1725 History   First MD Initiated Contact with Patient 09/16/16 1836     Chief Complaint  Patient presents with  . Sore Throat   (Consider location/radiation/quality/duration/timing/severity/associated sxs/prior Treatment) Pleasant obese 31 year old female complaining of a tingling in the throat this morning. By this afternoon she developed substantial pain in the throat associated with odynophagia, chills and fever. Also with fatigue and malaise. She says she has a history of allergies and constantly has PND, nasal congestion and runny nose. She is currently taking Claritin and Flonase.      Past Medical History:  Diagnosis Date  . Asthma    Past Surgical History:  Procedure Laterality Date  . CESAREAN SECTION     Family History  Problem Relation Age of Onset  . Hypertension Mother    Social History  Substance Use Topics  . Smoking status: Former Smoker    Packs/day: 0.50    Types: Cigarettes  . Smokeless tobacco: Never Used  . Alcohol use 0.0 oz/week     Comment: rare   OB History    No data available     Review of Systems  Constitutional: Positive for activity change, chills, fatigue and fever. Negative for appetite change.  HENT: Positive for congestion, postnasal drip, rhinorrhea and sore throat. Negative for facial swelling.   Eyes: Negative.   Respiratory: Negative.   Cardiovascular: Negative.   Musculoskeletal: Negative for neck pain and neck stiffness.  Skin: Negative for pallor and rash.  Neurological: Negative.   All other systems reviewed and are negative.   Allergies  Patient has no known allergies.  Home Medications   Prior to Admission medications   Medication Sig Start Date End Date Taking? Authorizing Provider  fluticasone (FLONASE) 50 MCG/ACT nasal spray Place 2 sprays into both nostrils daily. 07/13/16  Yes Vickie L Henson, NP-C  loratadine (CLARITIN) 10 MG tablet Take 1 tablet (10 mg total)  by mouth daily. 07/13/16  Yes Vickie L Henson, NP-C  amoxicillin (AMOXIL) 500 MG capsule Take 2 capsules (1,000 mg total) by mouth 2 (two) times daily. 09/16/16   Hayden Rasmussen, NP  EPINEPHrine 0.3 mg/0.3 mL IJ SOAJ injection Inject 0.3 mLs (0.3 mg total) into the muscle once. 03/22/15   Courteney Lyn Mackuen, MD   Meds Ordered and Administered this Visit  Medications - No data to display  BP (!) 151/93 (BP Location: Right Wrist)   Pulse 99   Temp 99.5 F (37.5 C) (Oral)   Resp 20   SpO2 100%  No data found.   Physical Exam  Constitutional: She is oriented to person, place, and time. She appears well-developed and well-nourished. No distress.  HENT:  Mouth/Throat: No oropharyngeal exudate.  Bilateral TMs are normal. Oropharynx with erythema, minor swelling and a few exudates. Epiglottis is well seen, pink and without edema.  Neck: Normal range of motion. Neck supple.  Cardiovascular: Normal rate, regular rhythm and normal heart sounds.   Pulmonary/Chest: Effort normal and breath sounds normal. No respiratory distress. She has no wheezes.  Musculoskeletal: Normal range of motion. She exhibits no edema.  Lymphadenopathy:    She has no cervical adenopathy.  Neurological: She is alert and oriented to person, place, and time.  Skin: Skin is warm and dry. No rash noted.  Psychiatric: She has a normal mood and affect.  Nursing note and vitals reviewed.   Urgent Care Course     Procedures (including critical care time)  Labs Review Labs Reviewed - No data to display  Imaging Review No results found.   Visual Acuity Review  Right Eye Distance:   Left Eye Distance:   Bilateral Distance:    Right Eye Near:   Left Eye Near:    Bilateral Near:         MDM   1. Exudative pharyngitis   2. Seasonal allergic rhinitis due to pollen    Continue the allergy medicines that you usually take. Drink plenty of cool liquids. Use Cepacol lozenges for sore throat pain and take  ibuprofen 600 mg every 6 hours. Take the amoxicillin as directed until all gone. 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 amoxicillin (AMOXIL) 500 MG capsule    Sig: Take 2 capsules (1,000 mg total) by mouth 2 (two) times daily.    Dispense:  32 capsule    Refill:  0    Order Specific Question:   Supervising Provider    Answer:   Eustace Moore [[id number]]       Hayden Rasmussen, NP 09/16/16 [phone]

## 2016-12-11 ENCOUNTER — Other Ambulatory Visit: Payer: Self-pay | Admitting: Family Medicine

## 2016-12-11 DIAGNOSIS — J309 Allergic rhinitis, unspecified: Secondary | ICD-10-CM

## 2016-12-11 NOTE — Telephone Encounter (Signed)
Is this okay to refill? 

## 2016-12-11 NOTE — Telephone Encounter (Signed)
Ok for 3 months 

## 2017-03-31 DIAGNOSIS — Z23 Encounter for immunization: Secondary | ICD-10-CM | POA: Diagnosis not present

## 2017-05-09 ENCOUNTER — Ambulatory Visit (HOSPITAL_COMMUNITY)
Admission: EM | Admit: 2017-05-09 | Discharge: 2017-05-09 | Disposition: A | Discharge status: Home / Self Care | Payer: BLUE CROSS/BLUE SHIELD | Attending: Internal Medicine | Admitting: Internal Medicine

## 2017-05-09 ENCOUNTER — Encounter (HOSPITAL_COMMUNITY): Payer: Self-pay

## 2017-05-09 DIAGNOSIS — R062 Wheezing: Secondary | ICD-10-CM | POA: Diagnosis not present

## 2017-05-09 DIAGNOSIS — R05 Cough: Secondary | ICD-10-CM | POA: Diagnosis not present

## 2017-05-09 DIAGNOSIS — J4521 Mild intermittent asthma with (acute) exacerbation: Secondary | ICD-10-CM | POA: Diagnosis not present

## 2017-05-09 DIAGNOSIS — R0602 Shortness of breath: Secondary | ICD-10-CM

## 2017-05-09 MED ORDER — ALBUTEROL SULFATE HFA 108 (90 BASE) MCG/ACT IN AERS: 1.0000 | INHALATION_SPRAY | Freq: Four times a day (QID) | RESPIRATORY_TRACT | 0 refills | Status: AC | PRN

## 2017-05-09 MED ORDER — PREDNISONE 20 MG PO TABS: 40.0000 mg | ORAL_TABLET | Freq: Every day | ORAL | 0 refills | Status: AC

## 2017-05-09 MED ORDER — IPRATROPIUM-ALBUTEROL 0.5-2.5 (3) MG/3ML IN SOLN
RESPIRATORY_TRACT | Status: AC
  Filled 2017-05-09: qty 3

## 2017-05-09 MED ORDER — IPRATROPIUM-ALBUTEROL 0.5-2.5 (3) MG/3ML IN SOLN
3.0000 mL | Freq: Once | RESPIRATORY_TRACT | Status: AC
  Administered 2017-05-09: 3 mL via RESPIRATORY_TRACT

## 2017-05-09 NOTE — Discharge Instructions (Signed)
Exacerbation of your asthma. Restart albuterol as needed for shortness of breath/wheezing. Prednisone as directed. You can continue claritin, can restart flonase. Keep hydrated, your urine should be clear to pale yellow in color. Monitor for any worsening of symptoms, chest pain, shortness of breath, wheezing, swelling of the throat, follow up for reevaluation.

## 2017-05-09 NOTE — ED Triage Notes (Signed)
Pt presents today with cough, wheezing, and SOB that started yesterday. States that she has asthma and has not been seen in a while to get an inhaler so she does not currently have an inhaler to use. She states that there is no productive cough but she is having a hard time breathing. Does not show any signs of distress.

## 2017-05-09 NOTE — ED Provider Notes (Signed)
[facility]    CSN: [id number] Arrival date & time: 05/09/17  1244     History   Chief Complaint Chief Complaint  Patient presents with  . Cough    HPI Karla Estrada is a 31 y.o. female.   31 year old female comes in for 2 day history of URI symptoms. States she started having sinus pressure that resolved and then started coughing. Cough is nonproductive, not worse at night. She has had wheezing and shortness of breath. Denies fever, chills, night sweats. States she has rhinorrhea and nasal congestion, which is normal for her with allergic rhinitis, and takes claritin for it. States with history of asthma, but has ran out of her inhaler and has not been using it. States last time she needed albuterol was about 1 year ago.       Past Medical History:  Diagnosis Date  . Asthma     Patient Active Problem List   Diagnosis Date Noted  . SORE THROAT 07/27/2008    Past Surgical History:  Procedure Laterality Date  . CESAREAN SECTION      OB History    No data available       Home Medications    Prior to Admission medications   Medication Sig Start Date End Date Taking? Authorizing Provider  fluticasone (FLONASE) 50 MCG/ACT nasal spray Place 2 sprays into both nostrils daily. 07/13/16  Yes Henson, Vickie L, NP-C  loratadine (CLARITIN) 10 MG tablet TAKE 1 TABLET (10 MG TOTAL) BY MOUTH DAILY. 12/11/16  Yes Henson, Vickie L, NP-C  albuterol (PROVENTIL HFA;VENTOLIN HFA) 108 (90 Base) MCG/ACT inhaler Inhale 1-2 puffs into the lungs every 6 (six) hours as needed for wheezing or shortness of breath. 05/09/17   Cathie HoopsYu, Gene Colee V, PA-C  EPINEPHrine 0.3 mg/0.3 mL IJ SOAJ injection Inject 0.3 mLs (0.3 mg total) into the muscle once. 03/22/15   Mackuen, Courteney Lyn, MD  predniSONE (DELTASONE) 20 MG tablet Take 2 tablets (40 mg total) by mouth daily for 4 days. 05/09/17 05/13/17  Belinda FisherYu, Aidah Forquer V, PA-C    Family History Family History  Problem Relation Age of Onset  .  Hypertension Mother     Social History Social History   Tobacco Use  . Smoking status: Former Smoker    Packs/day: 0.50    Types: Cigarettes  . Smokeless tobacco: Never Used  Substance Use Topics  . Alcohol use: Yes    Alcohol/week: 0.0 oz    Comment: rare  . Drug use: No     Allergies   Patient has no known allergies.   Review of Systems Review of Systems  Reason unable to perform ROS: See HPI as above.     Physical Exam Triage Vital Signs ED Triage Vitals  Enc Vitals Group     BP      Pulse      Resp      Temp      Temp src      SpO2      Weight      Height      Head Circumference      Peak Flow      Pain Score      Pain Loc      Pain Edu?      Excl. in GC?    No data found.  Updated Vital Signs BP (!) 157/89 (BP Location: Left Arm)   Pulse 86   Temp 98.4 F (36.9 C) (Oral)  Resp 16   SpO2 96%   Physical Exam  Constitutional: She is oriented to person, place, and time. She appears well-developed and well-nourished. No distress.  HENT:  Head: Normocephalic and atraumatic.  Right Ear: Tympanic membrane, external ear and ear canal normal. Tympanic membrane is not erythematous and not bulging.  Left Ear: Tympanic membrane, external ear and ear canal normal. Tympanic membrane is not erythematous and not bulging.  Nose: Nose normal. Right sinus exhibits no maxillary sinus tenderness and no frontal sinus tenderness. Left sinus exhibits no maxillary sinus tenderness and no frontal sinus tenderness.  Mouth/Throat: Uvula is midline, oropharynx is clear and moist and mucous membranes are normal.  Eyes: Conjunctivae are normal. Pupils are equal, round, and reactive to light.  Neck: Normal range of motion. Neck supple.  Cardiovascular: Normal rate, regular rhythm and normal heart sounds. Exam reveals no gallop and no friction rub.  No murmur heard. Pulmonary/Chest: Effort normal.  Patient coughing on exam. Able to finish full sentences.   Diffuse  wheezing throughout, decreased air movement.   Lymphadenopathy:    She has no cervical adenopathy.  Neurological: She is alert and oriented to person, place, and time.  Skin: Skin is warm and dry.  Psychiatric: She has a normal mood and affect. Her behavior is normal. Judgment normal.     UC Treatments / Results  Labs (all labs ordered are listed, but only abnormal results are displayed) Labs Reviewed - No data to display  EKG  EKG Interpretation None       Radiology No results found.  Procedures Procedures (including critical care time)  Medications Ordered in UC Medications  ipratropium-albuterol (DUONEB) 0.5-2.5 (3) MG/3ML nebulizer solution 3 mL (3 mLs Nebulization Given 05/09/17 1318)     Initial Impression / Assessment and Plan / UC Course  I have reviewed the triage vital signs and the nursing notes.  Pertinent labs & imaging results that were available during my care of the patient were reviewed by me and considered in my medical decision making (see chart for details).  Clinical Course as of May 09 1340  Sun May 09, 2017  1336 Lungs clear to auscultation bilaterally without adventitious lung sounds after duoneb. HR 93, O2 sat 98  [AY]    Clinical Course User Index [AY] Belinda FisherYu, Uel Davidow V, PA-C    Patient with much improved air movement after duoneb with resolved chest tightness. Will treat for asthma exacerbation. Albuterol inhaler refilled. Prednisone as directed. Symptomatic treatment discussed. Return precautions given.   Final Clinical Impressions(s) / UC Diagnoses   Final diagnoses:  Mild intermittent asthma with exacerbation    ED Discharge Orders        Ordered    albuterol (PROVENTIL HFA;VENTOLIN HFA) 108 (90 Base) MCG/ACT inhaler  Every 6 hours PRN     05/09/17 1338    predniSONE (DELTASONE) 20 MG tablet  Daily     05/09/17 1338         Belinda FisherYu, Mishti Swanton V, PA-C 05/09/17 1342

## 2017-07-21 DIAGNOSIS — J069 Acute upper respiratory infection, unspecified: Secondary | ICD-10-CM | POA: Diagnosis not present

## 2018-03-17 DIAGNOSIS — Z23 Encounter for immunization: Secondary | ICD-10-CM | POA: Diagnosis not present

## 2018-07-13 DIAGNOSIS — B354 Tinea corporis: Secondary | ICD-10-CM | POA: Diagnosis not present

## 2018-11-24 ENCOUNTER — Telehealth: Payer: Self-pay | Admitting: Family Medicine

## 2018-11-24 NOTE — Telephone Encounter (Signed)
Dismissal letter in guarantor snapshot  °

## 2018-12-31 DIAGNOSIS — Z20828 Contact with and (suspected) exposure to other viral communicable diseases: Secondary | ICD-10-CM | POA: Diagnosis not present

## 2020-02-03 DIAGNOSIS — Z20822 Contact with and (suspected) exposure to covid-19: Secondary | ICD-10-CM | POA: Diagnosis not present

## 2020-04-30 DIAGNOSIS — T7840XA Allergy, unspecified, initial encounter: Secondary | ICD-10-CM | POA: Insufficient documentation

## 2021-06-25 ENCOUNTER — Ambulatory Visit
Admission: EM | Admit: 2021-06-25 | Discharge: 2021-06-25 | Disposition: A | Discharge status: Home / Self Care | Payer: BC Managed Care – PPO | Attending: Emergency Medicine | Admitting: Emergency Medicine

## 2021-06-25 ENCOUNTER — Other Ambulatory Visit: Payer: Self-pay

## 2021-06-25 DIAGNOSIS — J029 Acute pharyngitis, unspecified: Secondary | ICD-10-CM

## 2021-06-25 DIAGNOSIS — R0981 Nasal congestion: Secondary | ICD-10-CM

## 2021-06-25 NOTE — ED Triage Notes (Signed)
Pt reports yesterday feeling congestion and having a runny nose.

## 2021-06-25 NOTE — ED Provider Notes (Signed)
[facility]    CSN: [id number] Arrival date & time: 06/25/21  1424    HISTORY  No chief complaint on file.  HPI LABELLE Karla Estrada is a 36 y.o. female. Pt reports yesterday she began to have nasal congestion and a runny nose.  States she is here today to make sure she is not contagious.  EMR reviewed, patient has previously been prescribed allergy medications, Flonase and Claritin, states she is not currently using either 1.  Patient also has a history of prescription for albuterol and epinephrine.  Vital signs are completely normal on exam today.  Patient is requesting a note for work.   Past Medical History:  Diagnosis Date   Asthma    Patient Active Problem List   Diagnosis Date Noted   SORE THROAT 07/27/2008   Past Surgical History:  Procedure Laterality Date   CESAREAN SECTION     OB History   No obstetric history on file.    Home Medications    Prior to Admission medications   Medication Sig Start Date End Date Taking? Authorizing Provider  albuterol (PROVENTIL HFA;VENTOLIN HFA) 108 (90 Base) MCG/ACT inhaler Inhale 1-2 puffs into the lungs every 6 (six) hours as needed for wheezing or shortness of breath. 05/09/17   Tasia Catchings, Amy V, PA-C  EPINEPHrine 0.3 mg/0.3 mL IJ SOAJ injection Inject 0.3 mLs (0.3 mg total) into the muscle once. 03/22/15   Mackuen, Courteney Lyn, MD  fluticasone (FLONASE) 50 MCG/ACT nasal spray Place 2 sprays into both nostrils daily. 07/13/16   Henson, Vickie L, PA-C  loratadine (CLARITIN) 10 MG tablet TAKE 1 TABLET (10 MG TOTAL) BY MOUTH DAILY. 12/11/16   Girtha Rm, PA-C   Family History Family History  Problem Relation Age of Onset   Hypertension Mother    Social History Social History   Tobacco Use   Smoking status: Former    Packs/day: 0.50    Types: Cigarettes   Smokeless tobacco: Never  Substance Use Topics   Alcohol use: Yes    Alcohol/week: 0.0 standard drinks    Comment: rare   Drug use: No   Allergies   Patient  has no known allergies.  Review of Systems Review of Systems Pertinent findings noted in history of present illness.   Physical Exam Triage Vital Signs ED Triage Vitals  Enc Vitals Group     BP 03/21/21 0827 (!) 147/82     Pulse Rate 03/21/21 0827 72     Resp 03/21/21 0827 18     Temp 03/21/21 0827 98.3 F (36.8 C)     Temp Source 03/21/21 0827 Oral     SpO2 03/21/21 0827 98 %     Weight --      Height --      Head Circumference --      Peak Flow --      Pain Score 03/21/21 0826 5     Pain Loc --      Pain Edu? --      Excl. in [place]? --   No data found.  Updated Vital Signs There were no vitals taken for this visit.  Physical Exam Vitals and nursing note reviewed.  Constitutional:      General: She is not in acute distress.    Appearance: Normal appearance. She is not ill-appearing.  HENT:     Head: Normocephalic and atraumatic.     Salivary Glands: Right salivary gland is not diffusely enlarged or tender. Left salivary gland is  not diffusely enlarged or tender.     Right Ear: Tympanic membrane, ear canal and external ear normal. No drainage. No middle ear effusion. There is no impacted cerumen. Tympanic membrane is not erythematous or bulging.     Left Ear: Tympanic membrane, ear canal and external ear normal. No drainage.  No middle ear effusion. There is no impacted cerumen. Tympanic membrane is not erythematous or bulging.     Nose: Nose normal. No nasal deformity, septal deviation, mucosal edema, congestion or rhinorrhea.     Right Turbinates: Not enlarged, swollen or pale.     Left Turbinates: Not enlarged, swollen or pale.     Right Sinus: No maxillary sinus tenderness or frontal sinus tenderness.     Left Sinus: No maxillary sinus tenderness or frontal sinus tenderness.     Mouth/Throat:     Lips: Pink. No lesions.     Mouth: Mucous membranes are moist. No oral lesions.     Pharynx: Oropharynx is clear. Uvula midline. No posterior oropharyngeal erythema or uvula  swelling.     Tonsils: No tonsillar exudate. 0 on the right. 0 on the left.  Eyes:     General: Lids are normal.        Right eye: No discharge.        Left eye: No discharge.     Extraocular Movements: Extraocular movements intact.     Conjunctiva/sclera: Conjunctivae normal.     Right eye: Right conjunctiva is not injected.     Left eye: Left conjunctiva is not injected.  Neck:     Trachea: Trachea and phonation normal.  Cardiovascular:     Rate and Rhythm: Normal rate and regular rhythm.     Pulses: Normal pulses.     Heart sounds: Normal heart sounds. No murmur heard.   No friction rub. No gallop.  Pulmonary:     Effort: Pulmonary effort is normal. No accessory muscle usage, prolonged expiration or respiratory distress.     Breath sounds: Normal breath sounds. No stridor, decreased air movement or transmitted upper airway sounds. No decreased breath sounds, wheezing, rhonchi or rales.  Chest:     Chest wall: No tenderness.  Musculoskeletal:        General: Normal range of motion.     Cervical back: Normal range of motion and neck supple. Normal range of motion.  Lymphadenopathy:     Cervical: No cervical adenopathy.  Skin:    General: Skin is warm and dry.     Findings: No erythema or rash.  Neurological:     General: No focal deficit present.     Mental Status: She is alert and oriented to person, place, and time.  Psychiatric:        Mood and Affect: Mood normal.        Behavior: Behavior normal.    Visual Acuity Right Eye Distance:   Left Eye Distance:   Bilateral Distance:    Right Eye Near:   Left Eye Near:    Bilateral Near:     UC Couse / Diagnostics / Procedures:    EKG  Radiology No results found.  Procedures Procedures (including critical care time)  UC Diagnoses / Final Clinical Impressions(s)   I have reviewed the triage vital signs and the nursing notes.  Pertinent labs & imaging results that were available during my care of the patient  were reviewed by me and considered in my medical decision making (see chart for details).   Final diagnoses:  None   COVID/flu testing performed at patient request, patient will be notified of results once received.  Patient advised to resume allergy medications.  Note provided for work.  Return precautions advised.  ED Prescriptions   None    PDMP not reviewed this encounter.  Pending results:  Labs Reviewed - No data to display  Medications Ordered in UC: Medications - No data to display  Disposition Upon Discharge:  Condition: stable for discharge home Home: take medications as prescribed; routine discharge instructions as discussed; follow up as advised.  Patient presented with an acute illness with associated systemic symptoms and significant discomfort requiring urgent management. In my opinion, this is a condition that a prudent lay person (someone who possesses an average knowledge of health and medicine) may potentially expect to result in complications if not addressed urgently such as respiratory distress, impairment of bodily function or dysfunction of bodily organs.   Routine symptom specific, illness specific and/or disease specific instructions were discussed with the patient and/or caregiver at length.   As such, the patient has been evaluated and assessed, work-up was performed and treatment was provided in alignment with urgent care protocols and evidence based medicine.  Patient/parent/caregiver has been advised that the patient may require follow up for further testing and treatment if the symptoms continue in spite of treatment, as clinically indicated and appropriate.  If the patient was tested for COVID-19, Influenza and/or RSV, then the patient/parent/guardian was advised to isolate at home pending the results of his/her diagnostic coronavirus test and potentially longer if theyre positive. I have also advised pt that if his/her COVID-19 test returns positive,  it's recommended to self-isolate for at least 10 days after symptoms first appeared AND until fever-free for 24 hours without fever reducer AND other symptoms have improved or resolved. Discussed self-isolation recommendations as well as instructions for household member/close contacts as per the [facility] DHHS, and also gave patient the [place] packet with this information.  Patient/parent/caregiver has been advised to return to the [facility] or PCP in 3-5 days if no better; to PCP or the Emergency Department if new signs and symptoms develop, or if the current signs or symptoms continue to change or worsen for further workup, evaluation and treatment as clinically indicated and appropriate  The patient will follow up with their current PCP if and as advised. If the patient does not currently have a PCP we will assist them in obtaining one.   The patient may need specialty follow up if the symptoms continue, in spite of conservative treatment and management, for further workup, evaluation, consultation and treatment as clinically indicated and appropriate.  Patient/parent/caregiver verbalized understanding and agreement of plan as discussed.  All questions were addressed during visit.  Please see discharge instructions below for further details of plan.  Discharge Instructions: Discharge Instructions   None     This office note has been dictated using Dragon speech recognition software.  Unfortunately, and despite my best efforts, this method of dictation can sometimes lead to occasional typographical or grammatical errors.  I apologize in advance if this occurs.     Lynden Oxford Scales, PA-C 06/25/21 [phone]

## 2021-06-25 NOTE — Discharge Instructions (Signed)
The results of your COVID and influenza test will be made available to you in the next 24 to 48 hours.  Initially they will be posted to your MyChart account and if one of the results is positive, you will be contacted by phone.  Given your history of allergic rhinitis, I recommend that you resume taking Flonase and Claritin.  Thank you for visiting urgent care today.

## 2021-06-26 LAB — COVID-19, FLU A+B NAA
Influenza A, NAA: NOT DETECTED
Influenza B, NAA: NOT DETECTED
SARS-CoV-2, NAA: NOT DETECTED

## 2021-10-06 ENCOUNTER — Ambulatory Visit
Admission: EM | Admit: 2021-10-06 | Discharge: 2021-10-06 | Disposition: A | Discharge status: Home / Self Care | Payer: BC Managed Care – PPO | Attending: Emergency Medicine | Admitting: Emergency Medicine

## 2021-10-06 DIAGNOSIS — J302 Other seasonal allergic rhinitis: Secondary | ICD-10-CM | POA: Diagnosis not present

## 2021-10-06 DIAGNOSIS — R0982 Postnasal drip: Secondary | ICD-10-CM

## 2021-10-06 MED ORDER — PROMETHAZINE-DM 6.25-15 MG/5ML PO SYRP: 5.0000 mL | ORAL_SOLUTION | Freq: Four times a day (QID) | ORAL | 0 refills | Status: AC | PRN

## 2021-10-06 MED ORDER — FLUTICASONE PROPIONATE 50 MCG/ACT NA SUSP: 1.0000 | Freq: Every day | NASAL | 1 refills | Status: AC

## 2021-10-06 MED ORDER — LORATADINE 10 MG PO TABS: 10.0000 mg | ORAL_TABLET | Freq: Every day | ORAL | 1 refills | Status: AC

## 2021-10-06 NOTE — Discharge Instructions (Signed)
I renewed your prescriptions for Claritin and Flonase that you were given back in 2018.  I also provided you with a prescription for Promethazine DM which should help settle your cough see get some sleep at night. ? ?Thank you for visiting urgent care today. ?

## 2021-10-06 NOTE — ED Provider Notes (Signed)
?[facility] ? ? ? ?CSN: [id number] ?Arrival date & time: 10/06/21  1212 ?  ? ?HISTORY  ? ?Chief Complaint  ?Patient presents with  ? Nasal Congestion  ? Cough  ? ?HPI ?Karla Estrada is a 36 y.o. female. Pt c/o congestion, cough and sneezing since Friday.  Patient states she has been taking over-the-counter generic Claritin, has not been using any.  Patient states cough is nonproductive and worse at night.  Patient states she has a tickle in her throat as well.  Patient denies otalgia, sinus pressure, sinus headache, nausea, vomiting, diarrhea, fever, body aches, chills. ? ?The history is provided by the patient.  ?Past Medical History:  ?Diagnosis Date  ? Asthma   ? ?Patient Active Problem List  ? Diagnosis Date Noted  ? SORE THROAT 07/27/2008  ? ?Past Surgical History:  ?Procedure Laterality Date  ? CESAREAN SECTION    ? ?OB History   ?No obstetric history on file. ?  ? ?Home Medications   ? ?Prior to Admission medications   ?Medication Sig Start Date End Date Taking? Authorizing Provider  ?albuterol (PROVENTIL HFA;VENTOLIN HFA) 108 (90 Base) MCG/ACT inhaler Inhale 1-2 puffs into the lungs every 6 (six) hours as needed for wheezing or shortness of breath. 05/09/17   Tasia Catchings, Amy V, PA-C  ?EPINEPHrine 0.3 mg/0.3 mL IJ SOAJ injection Inject 0.3 mLs (0.3 mg total) into the muscle once. 03/22/15   Mackuen, Courteney Lyn, MD  ?fluticasone (FLONASE) 50 MCG/ACT nasal spray Place 2 sprays into both nostrils daily. 07/13/16   Henson, Vickie L, NP-C  ?loratadine (CLARITIN) 10 MG tablet TAKE 1 TABLET (10 MG TOTAL) BY MOUTH DAILY. 12/11/16   Girtha Rm, NP-C  ? ?Family History ?Family History  ?Problem Relation Age of Onset  ? Hypertension Mother   ? ?Social History ?Social History  ? ?Tobacco Use  ? Smoking status: Former  ?  Packs/day: 0.50  ?  Types: Cigarettes  ? Smokeless tobacco: Never  ?Substance Use Topics  ? Alcohol use: Yes  ?  Alcohol/week: 0.0 standard drinks  ?  Comment: rare  ? Drug use: No   ? ?Allergies   ?Patient has no known allergies. ? ?Review of Systems ?Review of Systems ?Pertinent findings noted in history of present illness.  ? ?Physical Exam ?Triage Vital Signs ?ED Triage Vitals  ?Enc Vitals Group  ?   BP 03/21/21 0827 (!) 147/82  ?   Pulse Rate 03/21/21 0827 72  ?   Resp 03/21/21 0827 18  ?   Temp 03/21/21 0827 98.3 ?F (36.8 ?C)  ?   Temp Source 03/21/21 0827 Oral  ?   SpO2 03/21/21 0827 98 %  ?   Weight --   ?   Height --   ?   Head Circumference --   ?   Peak Flow --   ?   Pain Score 03/21/21 0826 5  ?   Pain Loc --   ?   Pain Edu? --   ?   Excl. in [place]? --   ?No data found. ? ?Updated Vital Signs ?BP 111/78 (BP Location: Left Arm)   Pulse 92   Temp 98 ?F (36.7 ?C) (Oral)   Resp 20   LMP 10/02/2021 (Exact Date)   SpO2 96%  ? ?Physical Exam ?Vitals and nursing note reviewed.  ?Constitutional:   ?   General: She is not in acute distress. ?   Appearance: Normal appearance. She is not ill-appearing.  ?HENT:  ?  Head: Normocephalic and atraumatic.  ?   Salivary Glands: Right salivary gland is not diffusely enlarged or tender. Left salivary gland is not diffusely enlarged or tender.  ?   Right Ear: Ear canal and external ear normal. No drainage. A middle ear effusion is present. There is no impacted cerumen. Tympanic membrane is bulging. Tympanic membrane is not injected or erythematous.  ?   Left Ear: Ear canal and external ear normal. No drainage. A middle ear effusion is present. There is no impacted cerumen. Tympanic membrane is bulging. Tympanic membrane is not injected or erythematous.  ?   Ears:  ?   Comments: Bilateral EACs normal, both TMs bulging with clear fluid ?   Nose: Rhinorrhea present. No nasal deformity, septal deviation, signs of injury, nasal tenderness, mucosal edema or congestion. Rhinorrhea is clear.  ?   Right Nostril: Occlusion present. No foreign body, epistaxis or septal hematoma.  ?   Left Nostril: Occlusion present. No foreign body, epistaxis or septal hematoma.   ?   Right Turbinates: Enlarged, swollen and pale.  ?   Left Turbinates: Enlarged, swollen and pale.  ?   Right Sinus: No maxillary sinus tenderness or frontal sinus tenderness.  ?   Left Sinus: No maxillary sinus tenderness or frontal sinus tenderness.  ?   Mouth/Throat:  ?   Lips: Pink. No lesions.  ?   Mouth: Mucous membranes are moist. No oral lesions.  ?   Pharynx: Oropharynx is clear. Uvula midline. No posterior oropharyngeal erythema or uvula swelling.  ?   Tonsils: No tonsillar exudate. 0 on the right. 0 on the left.  ?   Comments: Postnasal drip ?Eyes:  ?   General: Lids are normal.     ?   Right eye: No discharge.     ?   Left eye: No discharge.  ?   Extraocular Movements: Extraocular movements intact.  ?   Conjunctiva/sclera: Conjunctivae normal.  ?   Right eye: Right conjunctiva is not injected.  ?   Left eye: Left conjunctiva is not injected.  ?Neck:  ?   Trachea: Trachea and phonation normal.  ?Cardiovascular:  ?   Rate and Rhythm: Normal rate and regular rhythm.  ?   Pulses: Normal pulses.  ?   Heart sounds: Normal heart sounds. No murmur heard. ?  No friction rub. No gallop.  ?Pulmonary:  ?   Effort: Pulmonary effort is normal. No accessory muscle usage, prolonged expiration or respiratory distress.  ?   Breath sounds: Normal breath sounds. No stridor, decreased air movement or transmitted upper airway sounds. No decreased breath sounds, wheezing, rhonchi or rales.  ?Chest:  ?   Chest wall: No tenderness.  ?Musculoskeletal:     ?   General: Normal range of motion.  ?   Cervical back: Normal range of motion and neck supple. Normal range of motion.  ?Lymphadenopathy:  ?   Cervical: No cervical adenopathy.  ?Skin: ?   General: Skin is warm and dry.  ?   Findings: No erythema or rash.  ?Neurological:  ?   General: No focal deficit present.  ?   Mental Status: She is alert and oriented to person, place, and time.  ?Psychiatric:     ?   Mood and Affect: Mood normal.     ?   Behavior: Behavior normal.   ? ? ?Visual Acuity ?Right Eye Distance:   ?Left Eye Distance:   ?Bilateral Distance:   ? ?Right Eye Near:   ?  Left Eye Near:    ?Bilateral Near:    ? ?UC Couse / Diagnostics / Procedures:  ?  ?EKG ? ?Radiology ?No results found. ? ?Procedures ?Procedures (including critical care time) ? ?UC Diagnoses / Final Clinical Impressions(s)   ?I have reviewed the triage vital signs and the nursing notes. ? ?Pertinent labs & imaging results that were available during my care of the patient were reviewed by me and considered in my medical decision making (see chart for details).   ?Final diagnoses:  ?Seasonal allergic rhinitis, unspecified trigger  ?Postnasal drip  ? ?Patient advised to continue over-the-counter Claritin and to add Flonase.  Promethazine DM provided for cough while allergy medicines are taking their effect.  Return precautions advised. ? ?ED Prescriptions   ? ? Medication Sig Dispense Auth. Provider  ? loratadine (CLARITIN) 10 MG tablet Take 1 tablet (10 mg total) by mouth daily. 90 tablet Lynden Oxford Scales, PA-C  ? fluticasone (FLONASE) 50 MCG/ACT nasal spray Place 1 spray into both nostrils daily. Begin by using 2 sprays in each nare daily for 3 to 5 days, then decrease to 1 spray in each nare daily. 32 mL Lynden Oxford Scales, PA-C  ? promethazine-dextromethorphan (PROMETHAZINE-DM) 6.25-15 MG/5ML syrup Take 5 mLs by mouth 4 (four) times daily as needed for cough. 180 mL Lynden Oxford Scales, PA-C  ? ?  ? ?PDMP not reviewed this encounter. ? ?Pending results:  ?Labs Reviewed - No data to display ? ?Medications Ordered in UC: ?Medications - No data to display ? ?Disposition Upon Discharge:  ?Condition: stable for discharge home ?Home: take medications as prescribed; routine discharge instructions as discussed; follow up as advised. ? ?Patient presented with an acute illness with associated systemic symptoms and significant discomfort requiring urgent management. In my opinion, this is a condition  that a prudent lay person (someone who possesses an average knowledge of health and medicine) may potentially expect to result in complications if not addressed urgently such as respiratory distress, impairment of bodily f

## 2021-10-06 NOTE — ED Triage Notes (Signed)
Pt c/o congestion, cough and sneezing since Friday. ?

## 2023-02-09 ENCOUNTER — Other Ambulatory Visit (HOSPITAL_BASED_OUTPATIENT_CLINIC_OR_DEPARTMENT_OTHER): Payer: Self-pay

## 2023-02-09 MED ORDER — WEGOVY 0.25 MG/0.5ML ~~LOC~~ SOAJ
0.2500 mg | SUBCUTANEOUS | 0 refills | Status: DC
  Filled 2023-02-09 – 2023-03-07 (×2): qty 2, 28d supply, fill #0

## 2023-02-20 ENCOUNTER — Other Ambulatory Visit (HOSPITAL_BASED_OUTPATIENT_CLINIC_OR_DEPARTMENT_OTHER): Payer: Self-pay

## 2023-03-07 ENCOUNTER — Other Ambulatory Visit (HOSPITAL_BASED_OUTPATIENT_CLINIC_OR_DEPARTMENT_OTHER): Payer: Self-pay

## 2023-03-18 ENCOUNTER — Other Ambulatory Visit (HOSPITAL_BASED_OUTPATIENT_CLINIC_OR_DEPARTMENT_OTHER): Payer: Self-pay

## 2023-09-16 DIAGNOSIS — J45909 Unspecified asthma, uncomplicated: Secondary | ICD-10-CM | POA: Insufficient documentation

## 2023-10-21 ENCOUNTER — Ambulatory Visit: Admission: EM | Admit: 2023-10-21 | Discharge: 2023-10-21 | Disposition: A | Discharge status: Home / Self Care

## 2023-10-21 DIAGNOSIS — J039 Acute tonsillitis, unspecified: Secondary | ICD-10-CM | POA: Diagnosis not present

## 2023-10-21 LAB — POCT RAPID STREP A (OFFICE): Rapid Strep A Screen: NEGATIVE

## 2023-10-21 MED ORDER — CLINDAMYCIN HCL 300 MG PO CAPS: 300.0000 mg | ORAL_CAPSULE | Freq: Three times a day (TID) | ORAL | 0 refills | Status: AC

## 2023-10-21 NOTE — ED Provider Notes (Signed)
 [facility]    CSN: [id number] Arrival date & time: 10/21/23  0840      History   Chief Complaint Chief Complaint  Patient presents with   Sore Throat    HPI Karla Estrada is a 38 y.o. female.   Patient complains of swelling to her tonsils right greater than left.  Patient reports that she developed a sore throat yesterday.  She has increased swelling today.  Patient reports pain with swallowing.  No difficulty breathing.  Patient denies fever or chills.  She has not been around anyone who is sick.   The history is provided by the patient. No language interpreter was used.  Sore Throat    Past Medical History:  Diagnosis Date   Asthma     Patient Active Problem List   Diagnosis Date Noted   Asthma 09/16/2023   Class 3 severe obesity due to excess calories without serious comorbidity with body mass index (BMI) of 50.0 to 59.9 in adult 05/07/2020   Allergic reaction 04/30/2020   SORE THROAT 07/27/2008    Past Surgical History:  Procedure Laterality Date   CESAREAN SECTION      OB History   No obstetric history on file.      Home Medications    Prior to Admission medications   Medication Sig Start Date End Date Taking? Authorizing Provider  clindamycin (CLEOCIN) 300 MG capsule Take 1 capsule (300 mg total) by mouth 3 (three) times daily for 10 days. 10/21/23 10/31/23 Yes Sandi Crosby, PA-C  albuterol  (PROVENTIL  HFA;VENTOLIN  HFA) 108 (90 Base) MCG/ACT inhaler Inhale 1-2 puffs into the lungs every 6 (six) hours as needed for wheezing or shortness of breath. 05/09/17   Wilhelmenia Harada, Amy V, PA-C  Albuterol  Sulfate (PROAIR  RESPICLICK) 108 (90 Base) MCG/ACT AEPB Inhale 2 puffs into the lungs every 4 (four) hours as needed (Coughing/Wheezing).    [provider]  amLODipine (NORVASC) 2.5 MG tablet Take 1 tablet by mouth daily. 02/09/23 02/09/24 Yes [provider]  EPINEPHrine  0.3 mg/0.3 mL IJ SOAJ injection Inject 0.3 mLs (0.3 mg total) into the  muscle once. 03/22/15   Mackuen, Courteney Lyn, MD  fluticasone  (FLONASE ) 50 MCG/ACT nasal spray Place 1 spray into both nostrils daily. Begin by using 2 sprays in each nare daily for 3 to 5 days, then decrease to 1 spray in each nare daily. 10/06/21  Yes Eloise Hake Scales, PA-C  loratadine  (CLARITIN ) 10 MG tablet Take 1 tablet (10 mg total) by mouth daily. 10/06/21 10/21/23 Yes Eloise Hake Scales, PA-C  promethazine -dextromethorphan (PROMETHAZINE -DM) 6.25-15 MG/5ML syrup Take 5 mLs by mouth 4 (four) times daily as needed for cough. 10/06/21   Eloise Hake Scales, PA-C  Semaglutide -Weight Management (WEGOVY ) 0.25 MG/0.5ML SOAJ Inject 0.25 mg into the skin. 04/21/23  Yes [provider]    Family History Family History  Problem Relation Age of Onset   Hypertension Mother     Social History Social History   Tobacco Use   Smoking status: Former    Current packs/day: 0.50    Types: Cigarettes   Smokeless tobacco: Never  Vaping Use   Vaping status: Never Used  Substance Use Topics   Alcohol use: Yes    Alcohol/week: 0.0 standard drinks of alcohol    Comment: rare   Drug use: No     Allergies   Patient has no known allergies.   Review of Systems Review of Systems  HENT:  Positive for sore throat.   All  other systems reviewed and are negative.    Physical Exam Triage Vital Signs ED Triage Vitals  Encounter Vitals Group     BP 10/21/23 0918 (!) 144/93     Systolic BP Percentile --      Diastolic BP Percentile --      Pulse Rate 10/21/23 0918 (!) 110     Resp 10/21/23 0918 18     Temp 10/21/23 0918 100 F (37.8 C)     Temp Source 10/21/23 0918 Oral     SpO2 10/21/23 0918 98 %     Weight 10/21/23 0914 (!) 318 lb (144.2 kg)     Height 10/21/23 0914 5\' 1"  (1.549 m)     Head Circumference --      Peak Flow --      Pain Score 10/21/23 0911 8     Pain Loc --      Pain Education --      Exclude from Growth Chart --    No data found.  Updated Vital  Signs BP (!) 144/93 (BP Location: Left Arm)   Pulse (!) 110   Temp 100 F (37.8 C) (Oral)   Resp 18   Ht 5\' 1"  (1.549 m)   Wt (!) 144.2 kg   LMP 09/26/2023 (Approximate)   SpO2 98%   BMI 60.09 kg/m   Visual Acuity Right Eye Distance:   Left Eye Distance:   Bilateral Distance:    Right Eye Near:   Left Eye Near:    Bilateral Near:     Physical Exam Vitals and nursing note reviewed.  Constitutional:      Appearance: She is well-developed.  HENT:     Head: Normocephalic.     Right Ear: Tympanic membrane normal.     Left Ear: Tympanic membrane normal.     Mouth/Throat:     Pharynx: Posterior oropharyngeal erythema present.     Tonsils: Tonsillar exudate present. 3+ on the right. 2+ on the left.  Cardiovascular:     Rate and Rhythm: Normal rate.  Pulmonary:     Effort: Pulmonary effort is normal.  Abdominal:     General: There is no distension.  Musculoskeletal:        General: Normal range of motion.     Cervical back: Normal range of motion.  Skin:    General: Skin is warm.  Neurological:     General: No focal deficit present.     Mental Status: She is alert and oriented to person, place, and time.     Centor criteria 35%  strep is negative.  Pt has significant swelling to right tonsil.  No evidence of peritonsillar abscess.  Pt counseled on signs of abscess and need for follow up.  Rx for clindamycin UC Treatments / Results  Labs (all labs ordered are listed, but only abnormal results are displayed) Labs Reviewed  POCT RAPID STREP A (OFFICE) - Normal    EKG   Radiology No results found.  Procedures Procedures (including critical care time)  Medications Ordered in UC Medications - No data to display  Initial Impression / Assessment and Plan / UC Course  I have reviewed the triage vital signs and the nursing notes.  Pertinent labs & imaging results that were available during my care of the patient were reviewed by me and considered in my medical  decision making (see chart for details).      Final Clinical Impressions(s) / UC Diagnoses   Final diagnoses:  Acute tonsillitis,  unspecified etiology   Discharge Instructions      Tylenol every 4 hours for fever and pain.  Warm salt water gargles every 4 hours.  Return if increased swelling or pain    ED Prescriptions     Medication Sig Dispense Auth. Provider   clindamycin (CLEOCIN) 300 MG capsule Take 1 capsule (300 mg total) by mouth 3 (three) times daily for 10 days. 30 capsule Lylianna Fraiser K, PA-C      PDMP not reviewed this encounter. An After Visit Summary was printed and given to the patient.       Sandi Crosby, PA-C 10/21/23 1016

## 2023-10-21 NOTE — ED Triage Notes (Signed)
"  This started yesterday, waking up with sore throat (getting worse now) with cough today and this morning hard to swallow due to pain". No fever known.

## 2023-10-21 NOTE — Discharge Instructions (Addendum)
 Tylenol every 4 hours for fever and pain.  Warm salt water gargles every 4 hours.  Return if increased swelling or pain

## 2023-10-22 ENCOUNTER — Emergency Department (HOSPITAL_BASED_OUTPATIENT_CLINIC_OR_DEPARTMENT_OTHER)
Admission: EM | Admit: 2023-10-22 | Discharge: 2023-10-22 | Disposition: A | Discharge status: Home / Self Care | Attending: Emergency Medicine | Admitting: Emergency Medicine

## 2023-10-22 ENCOUNTER — Other Ambulatory Visit: Payer: Self-pay

## 2023-10-22 DIAGNOSIS — J029 Acute pharyngitis, unspecified: Secondary | ICD-10-CM | POA: Insufficient documentation

## 2023-10-22 MED ORDER — SODIUM CHLORIDE 0.9 % IV BOLUS
1000.0000 mL | Freq: Once | INTRAVENOUS | Status: AC
  Administered 2023-10-22: 1000 mL via INTRAVENOUS

## 2023-10-22 MED ORDER — DEXAMETHASONE SODIUM PHOSPHATE 10 MG/ML IJ SOLN
10.0000 mg | Freq: Once | INTRAMUSCULAR | Status: AC
  Administered 2023-10-22: 10 mg via INTRAVENOUS
  Filled 2023-10-22: qty 1

## 2023-10-22 MED ORDER — METHYLPREDNISOLONE 4 MG PO TBPK: ORAL_TABLET | ORAL | 0 refills | Status: AC

## 2023-10-22 MED ORDER — SODIUM CHLORIDE 0.9 % IV SOLN
3.0000 g | Freq: Once | INTRAVENOUS | Status: AC
  Administered 2023-10-22: 3 g via INTRAVENOUS

## 2023-10-22 MED ORDER — KETOROLAC TROMETHAMINE 15 MG/ML IJ SOLN
15.0000 mg | Freq: Once | INTRAMUSCULAR | Status: AC
Start: 2023-10-22 — End: 2023-10-22
  Administered 2023-10-22: 15 mg via INTRAVENOUS
  Filled 2023-10-22: qty 1

## 2023-10-22 NOTE — Discharge Instructions (Signed)
 Take next dose of steroid tomorrow morning.  Continue antibiotics.  Return if symptoms worsen as we discussed.

## 2023-10-22 NOTE — ED Provider Notes (Signed)
 [facility] Provider Note   CSN: [id number] Arrival date & time: 10/22/23  1843     History  Chief Complaint  Patient presents with   Sore Throat    Karla Estrada is a 38 y.o. female.  Patient here with ongoing sore throat.  Started on clindamycin yesterday.  Had negative strep test yesterday.  Denies any difficulty opening mouth or drooling or secretion.  No voice change.  Pain mostly with swallowing is what brought her back in.  She is taken a couple doses of clindamycin.  She denies any fever or chills.  The history is provided by the patient.       Home Medications Prior to Admission medications   Medication Sig Start Date End Date Taking? Authorizing Provider  methylPREDNISolone  (MEDROL  DOSEPAK) 4 MG TBPK tablet Follow package insert 10/22/23  Yes Orvel Cutsforth, DO  albuterol  (PROVENTIL  HFA;VENTOLIN  HFA) 108 (90 Base) MCG/ACT inhaler Inhale 1-2 puffs into the lungs every 6 (six) hours as needed for wheezing or shortness of breath. 05/09/17   Wilhelmenia Harada, Amy V, PA-C  Albuterol  Sulfate (PROAIR  RESPICLICK) 108 (90 Base) MCG/ACT AEPB Inhale 2 puffs into the lungs every 4 (four) hours as needed (Coughing/Wheezing).    [provider]  amLODipine (NORVASC) 2.5 MG tablet Take 1 tablet by mouth daily. 02/09/23 02/09/24  [provider]  clindamycin (CLEOCIN) 300 MG capsule Take 1 capsule (300 mg total) by mouth 3 (three) times daily for 10 days. 10/21/23 10/31/23  Sandi Crosby, PA-C  EPINEPHrine  0.3 mg/0.3 mL IJ SOAJ injection Inject 0.3 mLs (0.3 mg total) into the muscle once. 03/22/15   Mackuen, Courteney Lyn, MD  fluticasone  (FLONASE ) 50 MCG/ACT nasal spray Place 1 spray into both nostrils daily. Begin by using 2 sprays in each nare daily for 3 to 5 days, then decrease to 1 spray in each nare daily. 10/06/21   Eloise Hake Scales, PA-C  loratadine  (CLARITIN ) 10 MG tablet Take 1 tablet (10 mg total) by mouth daily. 10/06/21  10/21/23  Eloise Hake Scales, PA-C  promethazine -dextromethorphan (PROMETHAZINE -DM) 6.25-15 MG/5ML syrup Take 5 mLs by mouth 4 (four) times daily as needed for cough. 10/06/21   Eloise Hake Scales, PA-C  Semaglutide -Weight Management (WEGOVY ) 0.25 MG/0.5ML SOAJ Inject 0.25 mg into the skin. 04/21/23   [provider]      Allergies    Patient has no known allergies.    Review of Systems   Review of Systems  Physical Exam Updated Vital Signs BP 117/79   Pulse 99   Temp 99.2 F (37.3 C)   Resp 15   Ht 5\' 1"  (1.549 m)   Wt (!) 144.2 kg   LMP 09/26/2023 (Approximate)   SpO2 100%   BMI 60.09 kg/m  Physical Exam Vitals and nursing note reviewed.  Constitutional:      General: She is not in acute distress.    Appearance: She is well-developed.  HENT:     Head: Normocephalic and atraumatic.     Right Ear: Tympanic membrane normal.     Left Ear: Tympanic membrane normal.     Nose: No congestion.     Mouth/Throat:     Mouth: Mucous membranes are moist.     Pharynx: Uvula midline. Oropharyngeal exudate and posterior oropharyngeal erythema present. No uvula swelling.     Tonsils: Tonsillar exudate present.  Eyes:     Conjunctiva/sclera: Conjunctivae normal.  Cardiovascular:     Rate and Rhythm: Normal rate  and regular rhythm.     Heart sounds: No murmur heard. Pulmonary:     Effort: Pulmonary effort is normal. No respiratory distress.     Breath sounds: Normal breath sounds.  Abdominal:     Palpations: Abdomen is soft.     Tenderness: There is no abdominal tenderness.  Musculoskeletal:        General: No swelling.     Cervical back: Neck supple.  Skin:    General: Skin is warm and dry.     Capillary Refill: Capillary refill takes less than 2 seconds.  Neurological:     Mental Status: She is alert.  Psychiatric:        Mood and Affect: Mood normal.     ED Results / Procedures / Treatments   Labs (all labs ordered are listed, but only abnormal results  are displayed) Labs Reviewed - No data to display  EKG None  Radiology No results found.  Procedures Procedures    Medications Ordered in ED Medications  Ampicillin-Sulbactam (UNASYN) 3 g in sodium chloride  0.9 % 100 mL IVPB (3 g Intravenous New Bag/Given 10/22/23 1950)  dexamethasone (DECADRON) injection 10 mg (10 mg Intravenous Given 10/22/23 1943)  sodium chloride  0.9 % bolus 1,000 mL (1,000 mLs Intravenous New Bag/Given 10/22/23 1943)  ketorolac (TORADOL) 15 MG/ML injection 15 mg (15 mg Intravenous Given 10/22/23 1943)    ED Course/ Medical Decision Making/ A&P                                 Medical Decision Making Risk Prescription drug management.   Karla Estrada is here with ongoing sore throat.  Negative strep test earlier this week.  Has been on clindamycin for few doses.  Having ongoing pain with swallowing.  Denies any difficulty opening mouth.  No change in voice.  On exam there is no trismus no drooling no submandibular swelling.  She is well-appearing.  She does have exudates on both tonsils may be a little bit more slight fullness on the right than the left but uvula is midline with no swelling.  I do not appreciate any obvious abscess.  May be early phlegmon process.  We discussed at length about this.  I think she still somewhat early in her antibiotic treatment right now.  Just started clindamycin yesterday.  Will give her dose IV Unasyn Decadron and Toradol here in the ED with some IV fluids.  I think at this time we can hold on any imaging as I do not think there is a major abscess right now or anything that would be drainable.  Clinically we discussed that I think this could progress to that but I think you are still early enough that hopefully we can get ahead of treatment.  She is amenable to this plan.  She is given the medications felt better and was discharged.  She understands strict return precautions including trismus, difficulty handling secretions, worsening  swelling, worsening pain or any other concerning features.  Discharge.  This chart was dictated using voice recognition software.  Despite best efforts to proofread,  errors can occur which can change the documentation meaning.         Final Clinical Impression(s) / ED Diagnoses Final diagnoses:  Acute pharyngitis, unspecified etiology    Rx / DC Orders ED Discharge Orders          Ordered    methylPREDNISolone  (MEDROL  DOSEPAK) 4  MG TBPK tablet        10/22/23 2008              Lowery Rue, DO 10/22/23 2017

## 2023-10-22 NOTE — ED Triage Notes (Signed)
 Pt POV steady gait- reports pt being seen at [facility] on Wednesday and prescribed antibiotics, taking as prescribed. Started yesterday afternoon.  Reports increased difficulty swallowing and worsening throat swelling since Wednesday.
